# Patient Record
Sex: Female | Born: 1950 | Race: White | Hispanic: No | Marital: Married | Smoking: Current some day smoker
Health system: Southern US, Community
[De-identification: ages and names within clinical notes are randomized; demographics above are authoritative.]

## PROBLEM LIST (undated history)

## (undated) DIAGNOSIS — E559 Vitamin D deficiency, unspecified: Secondary | ICD-10-CM

## (undated) DIAGNOSIS — M8580 Other specified disorders of bone density and structure, unspecified site: Secondary | ICD-10-CM

## (undated) DIAGNOSIS — M810 Age-related osteoporosis without current pathological fracture: Secondary | ICD-10-CM

## (undated) DIAGNOSIS — K219 Gastro-esophageal reflux disease without esophagitis: Secondary | ICD-10-CM

## (undated) DIAGNOSIS — F411 Generalized anxiety disorder: Secondary | ICD-10-CM

## (undated) DIAGNOSIS — C73 Malignant neoplasm of thyroid gland: Secondary | ICD-10-CM

## (undated) DIAGNOSIS — R079 Chest pain, unspecified: Secondary | ICD-10-CM

## (undated) DIAGNOSIS — Z720 Tobacco use: Secondary | ICD-10-CM

## (undated) DIAGNOSIS — F32A Depression, unspecified: Secondary | ICD-10-CM

## (undated) DIAGNOSIS — E032 Hypothyroidism due to medicaments and other exogenous substances: Secondary | ICD-10-CM

## (undated) HISTORY — PX: THYROID SURGERY: SHX805

## (undated) HISTORY — DX: Vitamin D deficiency, unspecified: E55.9

## (undated) HISTORY — DX: Chest pain, unspecified: R07.9

## (undated) HISTORY — DX: Hypothyroidism due to medicaments and other exogenous substances: E03.2

## (undated) HISTORY — DX: Age-related osteoporosis without current pathological fracture: M81.0

## (undated) HISTORY — DX: Tobacco use: Z72.0

## (undated) HISTORY — DX: Generalized anxiety disorder: F41.1

## (undated) HISTORY — DX: Malignant neoplasm of thyroid gland: C73

---

## 1981-05-08 HISTORY — PX: CARPAL TUNNEL RELEASE: SHX101

## 2003-10-06 HISTORY — PX: COLONOSCOPY W/ POLYPECTOMY: SHX1380

## 2008-02-13 ENCOUNTER — Ambulatory Visit: Payer: Self-pay | Admitting: Otolaryngology

## 2008-05-08 HISTORY — PX: COLONOSCOPY: SHX174

## 2008-08-11 ENCOUNTER — Ambulatory Visit: Payer: Self-pay | Admitting: General Surgery

## 2015-07-12 ENCOUNTER — Emergency Department: Payer: BLUE CROSS/BLUE SHIELD

## 2015-07-12 ENCOUNTER — Emergency Department
Admission: EM | Admit: 2015-07-12 | Discharge: 2015-07-12 | Disposition: A | Discharge status: Home / Self Care | Payer: BLUE CROSS/BLUE SHIELD | Attending: Emergency Medicine | Admitting: Emergency Medicine

## 2015-07-12 DIAGNOSIS — T189XXA Foreign body of alimentary tract, part unspecified, initial encounter: Secondary | ICD-10-CM | POA: Diagnosis not present

## 2015-07-12 DIAGNOSIS — F17200 Nicotine dependence, unspecified, uncomplicated: Secondary | ICD-10-CM | POA: Diagnosis not present

## 2015-07-12 DIAGNOSIS — R070 Pain in throat: Secondary | ICD-10-CM

## 2015-07-12 DIAGNOSIS — Y93E1 Activity, personal bathing and showering: Secondary | ICD-10-CM | POA: Insufficient documentation

## 2015-07-12 DIAGNOSIS — Y998 Other external cause status: Secondary | ICD-10-CM | POA: Insufficient documentation

## 2015-07-12 DIAGNOSIS — X58XXXA Exposure to other specified factors, initial encounter: Secondary | ICD-10-CM | POA: Diagnosis not present

## 2015-07-12 DIAGNOSIS — Y9289 Other specified places as the place of occurrence of the external cause: Secondary | ICD-10-CM | POA: Insufficient documentation

## 2015-07-12 NOTE — ED Notes (Signed)
Xray negative; pt changed to flex wait

## 2015-07-12 NOTE — ED Notes (Signed)
Pt arrives to ER via POV states that she swallowed a plastic baby at a baby shower yesterday on accident. States that plastic baby was approx 0.5inch. Pt does not have any trouble breathing or talking. Secretions normal. Pt tried to get it down by eating and drinking today with no success. Pt hx of fishbone stuck in throat x 2 previously. Pt denies pain. Pt alert and oriented X4, active, cooperative, pt in NAD. RR even and unlabored, color WNL.

## 2015-07-12 NOTE — ED Provider Notes (Signed)
CSN: [id number]     Arrival date & time 07/12/15  1856 History   First MD Initiated Contact with Patient 07/12/15 2039     Chief Complaint  Patient presents with  . Swallowed Foreign Body  . Foreign Body     (Consider location/radiation/quality/duration/timing/severity/associated sxs/prior Treatment) HPI  65 year old female presents to emergency department for evaluation of possible foreign body ingestion. Patient states yesterday afternoon, she was at a baby shower, she thinks she may have swallowed a 3 x 1.5 cm soft plastic/rubber baby. After the possible ingestion, patient felt slight discomfort in the left side of her throat that she describes as pressure. Patient did eat dinner last night with no difficulty. Today, this morning patient had a piece of toast and she has had water throughout the day. She has not wanted to eat much today due to the discomfort along the left pharyngeal area. She denies any pain. She is tolerating her saliva well. She denies any chest pain or shortness of breath.  History reviewed. No pertinent past medical history. History reviewed. No pertinent past surgical history. No family history on file. Social History  Substance Use Topics  . Smoking status: Current Every Day Smoker  . Smokeless tobacco: None  . Alcohol Use: No   OB History    No data available     Review of Systems  Constitutional: Negative for fever, chills, activity change and fatigue.  HENT: Positive for sore throat. Negative for congestion and sinus pressure.   Eyes: Negative for visual disturbance.  Respiratory: Negative for cough, choking, chest tightness and shortness of breath.   Cardiovascular: Negative for chest pain and leg swelling.  Gastrointestinal: Negative for nausea, vomiting, abdominal pain and diarrhea.  Genitourinary: Negative for dysuria.  Musculoskeletal: Negative for arthralgias and gait problem.  Skin: Negative for rash.  Neurological: Negative for weakness,  numbness and headaches.  Hematological: Negative for adenopathy.  Psychiatric/Behavioral: Negative for behavioral problems, confusion and agitation.      Allergies  Codeine  Home Medications   Prior to Admission medications   Not on File   BP 131/82 mmHg  Pulse 90  Temp(Src) 97.6 F (36.4 C) (Oral)  Resp 20  Ht 5\' 3"  (1.6 m)  Wt 70.308 kg  BMI 27.46 kg/m2  SpO2 98% Physical Exam  Constitutional: She is oriented to person, place, and time. She appears well-developed and well-nourished. No distress.  HENT:  Head: Normocephalic and atraumatic.  Mouth/Throat: Oropharynx is clear and moist. No oropharyngeal exudate.  No visible foreign body in the retropharyngeal space.  Eyes: EOM are normal. Pupils are equal, round, and reactive to light. Right eye exhibits no discharge. Left eye exhibits no discharge.  Neck: Normal range of motion. Neck supple. No tracheal deviation present.  Cardiovascular: Normal rate, regular rhythm and intact distal pulses.   Pulmonary/Chest: Effort normal and breath sounds normal. No stridor. No respiratory distress. She has no wheezes. She has no rales. She exhibits no tenderness.  Abdominal: Soft. She exhibits no distension. There is no tenderness.  Musculoskeletal: Normal range of motion. She exhibits no edema.  Lymphadenopathy:    She has no cervical adenopathy.  Neurological: She is alert and oriented to person, place, and time. She has normal reflexes.  Skin: Skin is warm and dry.  Psychiatric: She has a normal mood and affect. Her behavior is normal. Thought content normal.    ED Course  Procedures (including critical care time) Labs Review Labs Reviewed - No data to display  Imaging  Review Dg Neck Soft Tissue  07/12/2015  CLINICAL DATA:  Pt states she swallowed a plastic baby at a baby shower yesterday on accident. States that plastic baby was approx 0.5inch. Pt does not have any trouble breathing or talking. Pt tried to get it down by  eating and drinking today but feels like something is caught in her throat EXAM: NECK SOFT TISSUES - 1+ VIEW COMPARISON:  None. FINDINGS: Moderate degenerative change to the central cervical spine. No prevertebral soft tissue swelling. Epiglottis appears normal. No opaque foreign body. IMPRESSION: No acute finding Electronically Signed   By: Skipper Cliche M.D.   On: 07/12/2015 19:36   Dg Chest 2 View  07/12/2015  CLINICAL DATA:  Patient reports swallowing a plastic foreign body yesterday. Persistent foreign body sensation in throat. EXAM: CHEST  2 VIEW COMPARISON:  None. FINDINGS: The heart size and mediastinal contours are normal. The lungs are clear. There is no pleural effusion or pneumothorax. No foreign bodies are identified within the chest. The bones appear normal. IMPRESSION: No active cardiopulmonary process. No evidence of foreign body within the chest. Plastic foreign bodies may not be visible radiographically. Electronically Signed   By: Richardean Sale M.D.   On: 07/12/2015 19:36   I have personally reviewed and evaluated these images and lab results as part of my medical decision-making.   EKG Interpretation None      MDM   Final diagnoses:  Throat pain in adult  Foreign body ingestion, initial encounter    65 year old female with possible foreign body ingestion. Patient estimates size to be 3 x 1.5 cm, soft rubber object. She is in no respiratory distress, tolerating her secretions well. No coughing or distress. She has tolerated by mouth well today. Discussed with ENT, patient likely with irritation of the esophageal mucosa. If no improvement in 2-3 days, she will need to follow-up with ENT for barium swallow. Patient's given information to follow-up with Dr. Ladene Artist. Return to the ER for any worsening symptoms or urgent changes in her health.  Duanne Guess, PA-C 07/12/15 2131  Orbie Pyo, MD 07/12/15 [phone]

## 2015-07-12 NOTE — Discharge Instructions (Signed)
Swallowed Foreign Body, Adult °A swallowed foreign body is an object that gets stuck in the tube that connects your throat to your stomach (esophagus) or in another part of your digestive tract. Foreign bodies may be swallowed by accident or on purpose. °When you swallow an object, it passes into your esophagus. The narrowest place in your digestive system is where your esophagus meets your stomach. If the object can pass through that place, it will usually continue through the rest of your digestive system without causing problems. A foreign body that gets stuck may need to be removed. °It is very important to tell your health care provider what you have swallowed. Certain swallowed items can be life-threatening. You may need emergency treatment. Dangerous swallowed foreign bodies include: °· Objects that get stuck in your throat. °· Objects that interfere with your breathing. °· Sharp objects. °· Harmful objects, such as batteries or illegal drugs. °CAUSES °The most common swallowed foreign body is food that will not pass through your esophagus to your stomach (food impaction). Foods that commonly become impacted include meats and hard vegetables, such as carrots and radishes. Other common swallowed foreign bodies include: °· Pieces of bone from meats. °· Toothpicks. °· Dentures. °RISK FACTORS °You are more likely to have a swallowed foreign body if: °· You wear dentures. °· You have been drinking alcohol or taking drugs. °· You have a mental health condition. °· You have a narrowed or scarred area in your digestive tract. °SYMPTOMS °· Pain or pressure in your throat or chest. °· Not being able to swallow food or liquid. °· Not being able to swallow your saliva. °· Choking. °· Hoarse voice. °· Trouble breathing. °DIAGNOSIS °This condition may be diagnosed based on your symptoms and medical history. Your health care provider will do a physical exam to confirm the diagnosis and to find the object. Imaging studies  may be done, including: °· X-rays. °· A CT scan. °Some objects may not be seen on imaging studies. In those cases, an exam may be done using a long tubelike scope to look into your esophagus (endoscopy). The tube (endoscope) that is used for this exam may be stiff (rigid) or flexible, depending on where the foreign body is stuck. °TREATMENT °Usually, an object that has passed into your stomach but is not dangerous will pass out of your digestive system without treatment. °If the swallowed object is not dangerous but it is stuck in your esophagus: °· You may be given medicine to relax the muscles of your esophagus to allow the object to pass through. °· Endoscopy may be done to find and remove the object if it does not pass with medicine. Your health care provider will put medical instruments through the endoscope to remove the object. °You may need emergency treatment if: °· The object is in your esophagus and is causing you to inhale saliva into your lungs (aspirate). °· The object is in your esophagus and is pressing on your airway. This makes it hard for you to breathe. °· The object can damage your digestive tract. Some objects that can cause damage include batteries, magnets, sharp objects, and drugs. °HOME CARE INSTRUCTIONS °If the object in your digestive system is expected to pass: °· Continue eating what you usually eat, unless your health care provider gives you different instructions. °· Check your stool after every bowel movement to see if you have passed the object. °· Contact your health care provider if the object has not passed after 3   days. °If you had endoscopic surgery to remove the foreign body: °· Follow instructions from your health care provider about caring for yourself after the procedure. °Keep all follow-up visits and repeat imaging tests as told by your health care provider. This is important. °SEEK MEDICAL CARE IF: °· You continue to have symptoms of a swallowed foreign body. °· The  object has not passed out of your body after 3 days. °SEEK IMMEDIATE MEDICAL CARE IF: °· You have a fever. °· You have pain in your chest or your abdomen. °· You cough up blood. °· You have blood in your stool (feces) or your vomit. °  °This information is not intended to replace advice given to you by your health care provider. Make sure you discuss any questions you have with your health care provider. °  °Document Released: 10/12/2009 Document Revised: 01/13/2015 Document Reviewed: 07/22/2014 °Elsevier Interactive Patient Education ©2016 Elsevier Inc. ° °

## 2015-07-12 NOTE — ED Notes (Signed)
Patient with no complaints at this time. Respirations even and unlabored. Skin warm/dry. Discharge instructions reviewed with patient at this time. Patient given opportunity to voice concerns/ask questions. Patient discharged at this time and left Emergency Department with steady gait.   

## 2016-10-09 ENCOUNTER — Encounter: Payer: Self-pay | Admitting: *Deleted

## 2016-10-12 ENCOUNTER — Ambulatory Visit (INDEPENDENT_AMBULATORY_CARE_PROVIDER_SITE_OTHER): Payer: BLUE CROSS/BLUE SHIELD | Admitting: General Surgery

## 2016-10-12 ENCOUNTER — Encounter: Payer: Self-pay | Admitting: General Surgery

## 2016-10-12 VITALS — BP 124/78 | HR 72 | Ht 62.0 in | Wt 152.0 lb

## 2016-10-12 DIAGNOSIS — Z86010 Personal history of colonic polyps: Secondary | ICD-10-CM

## 2016-10-12 MED ORDER — POLYETHYLENE GLYCOL 3350 17 GM/SCOOP PO POWD: 1.0000 | Freq: Once | ORAL | 0 refills | Status: AC

## 2016-10-12 NOTE — Progress Notes (Signed)
Patient ID: Alyssa Holland, female   DOB: 1950/11/10, 66 y.o.   MRN: [id number]  Chief Complaint  Patient presents with  . Colonoscopy    HPI [name] is a 66 y.o. female here today for a evaluation of a colonoscopy. Last colonoscopy was done on 08/11/2008. Patient states she is moving her bowels daily. No Gi problems at this time.    HPI  Past Medical History:  Diagnosis Date  . Thyroid disease     Past Surgical History:  Procedure Laterality Date  . CARPAL TUNNEL RELEASE Bilateral 1983  . COLONOSCOPY  2010   Normal  . COLONOSCOPY W/ POLYPECTOMY  10/06/2003   9 mm pedunculated tubular adenoma without atypia  . THYROID SURGERY Right     No family history on file.  Social History Social History  Substance Use Topics  . Smoking status: Current Every Day Smoker    Packs/day: 1.00    Types: Cigarettes  . Smokeless tobacco: Never Used  . Alcohol use No    Allergies  Allergen Reactions  . Codeine     Current Outpatient Prescriptions  Medication Sig Dispense Refill  . Calcium Carb-Cholecalciferol (CALCIUM PLUS VITAMIN D3 PO) Take 1,200 mg by mouth daily.    . citalopram (CELEXA) 20 MG tablet Take 20 mg by mouth daily.    . ergocalciferol (VITAMIN D2) 50000 units capsule Take 50,000 Units by mouth once a week.    . levothyroxine (SYNTHROID, LEVOTHROID) 100 MCG tablet Take 100 mcg by mouth daily before breakfast.     No current facility-administered medications for this visit.     Review of Systems Review of Systems  Constitutional: Negative.   Respiratory: Negative.   Cardiovascular: Negative.     Blood pressure 124/78, pulse 72, height 5\' 2"  (1.575 m), weight 152 lb (68.9 kg).  Physical Exam Physical Exam  Constitutional: She is oriented to person, place, and time. She appears well-developed and well-nourished.  Eyes: Conjunctivae are normal. No scleral icterus.  Neck: Neck supple.  Cardiovascular: Normal rate, regular rhythm and normal heart  sounds.   Pulmonary/Chest: Effort normal and breath sounds normal.  Lymphadenopathy:    She has no cervical adenopathy.  Neurological: She is alert and oriented to person, place, and time.  Skin: Skin is warm and dry.    Data Reviewed 9 mm tubular adenoma the sigmoid colon and 2005. Normal exam in 2010.  Assessment    Candidate for screening colonoscopy.     Plan        Colonoscopy with possible biopsy/polypectomy prn: Information regarding the procedure, including its potential risks and complications (including but not limited to perforation of the bowel, which may require emergency surgery to repair, and bleeding) was verbally given to the patient. Educational information regarding lower intestinal endoscopy was given to the patient. Written instructions for how to complete the bowel prep using Miralax were provided. The importance of drinking ample fluids to avoid dehydration as a result of the prep emphasized.  HPI, Physical Exam, Assessment and Plan have been scribed under the direction and in the presence of Hervey Ard, MD.  Gaspar Cola, CMA'  I have completed the exam and reviewed the above documentation for accuracy and completeness.  I agree with the above.  Haematologist has been used and any errors in dictation or transcription are unintentional.  Hervey Ard, M.D., F.A.C.S.   The patient is scheduled for a Colonoscopy at [facility] on 12/19/16. They are aware to call the day before  to get their arrival time. Miralax prescription has been sent into the patient's pharmacy. The patient is aware of date and instructions.  Documented by Lesly Rubenstein LPN  Robert Bellow 10/13/2016, 9:40 PM

## 2016-10-12 NOTE — Patient Instructions (Addendum)
Colonoscopy, Adult A colonoscopy is an exam to look at the entire large intestine. During the exam, a lubricated, bendable tube is inserted into the anus and then passed into the rectum, colon, and other parts of the large intestine. A colonoscopy is often done as a part of normal colorectal screening or in response to certain symptoms, such as anemia, persistent diarrhea, abdominal pain, and blood in the stool. The exam can help screen for and diagnose medical problems, including:  Tumors.  Polyps.  Inflammation.  Areas of bleeding.  Tell a health care provider about:  Any allergies you have.  All medicines you are taking, including vitamins, herbs, eye drops, creams, and over-the-counter medicines.  Any problems you or family members have had with anesthetic medicines.  Any blood disorders you have.  Any surgeries you have had.  Any medical conditions you have.  Any problems you have had passing stool. What are the risks? Generally, this is a safe procedure. However, problems may occur, including:  Bleeding.  A tear in the intestine.  A reaction to medicines given during the exam.  Infection (rare).  What happens before the procedure? Eating and drinking restrictions Follow instructions from your health care provider about eating and drinking, which may include:  A few days before the procedure - follow a low-fiber diet. Avoid nuts, seeds, dried fruit, raw fruits, and vegetables.  1-3 days before the procedure - follow a clear liquid diet. Drink only clear liquids, such as clear broth or bouillon, black coffee or tea, clear juice, clear soft drinks or sports drinks, gelatin dessert, and popsicles. Avoid any liquids that contain red or purple dye.  On the day of the procedure - do not eat or drink anything during the 2 hours before the procedure, or within the time period that your health care provider recommends.  Bowel prep If you were prescribed an oral bowel prep  to clean out your colon:  Take it as told by your health care provider. Starting the day before your procedure, you will need to drink a large amount of medicated liquid. The liquid will cause you to have multiple loose stools until your stool is almost clear or light green.  If your skin or anus gets irritated from diarrhea, you may use these to relieve the irritation: ? Medicated wipes, such as adult wet wipes with aloe and vitamin E. ? A skin soothing-product like petroleum jelly.  If you vomit while drinking the bowel prep, take a break for up to 60 minutes and then begin the bowel prep again. If vomiting continues and you cannot take the bowel prep without vomiting, call your health care provider.  General instructions  Ask your health care provider about changing or stopping your regular medicines. This is especially important if you are taking diabetes medicines or blood thinners.  Plan to have someone take you home from the hospital or clinic. What happens during the procedure?  An IV tube may be inserted into one of your veins.  You will be given medicine to help you relax (sedative).  To reduce your risk of infection: ? Your health care team will wash or sanitize their hands. ? Your anal area will be washed with soap.  You will be asked to lie on your side with your knees bent.  Your health care provider will lubricate a long, thin, flexible tube. The tube will have a camera and a light on the end.  The tube will be inserted into your   anus.  The tube will be gently eased through your rectum and colon.  Air will be delivered into your colon to keep it open. You may feel some pressure or cramping.  The camera will be used to take images during the procedure.  A small tissue sample may be removed from your body to be examined under a microscope (biopsy). If any potential problems are found, the tissue will be sent to a lab for testing.  If small polyps are found, your  health care provider may remove them and have them checked for cancer cells.  The tube that was inserted into your anus will be slowly removed. The procedure may vary among health care providers and hospitals. What happens after the procedure?  Your blood pressure, heart rate, breathing rate, and blood oxygen level will be monitored until the medicines you were given have worn off.  Do not drive for 24 hours after the exam.  You may have a small amount of blood in your stool.  You may pass gas and have mild abdominal cramping or bloating due to the air that was used to inflate your colon during the exam.  It is up to you to get the results of your procedure. Ask your health care provider, or the department performing the procedure, when your results will be ready. This information is not intended to replace advice given to you by your health care provider. Make sure you discuss any questions you have with your health care provider. Document Released: 04/21/2000 Document Revised: 02/23/2016 Document Reviewed: 07/06/2015 Elsevier Interactive Patient Education  [employer].  The patient is scheduled for a Colonoscopy at [facility] on 12/19/16. They are aware to call the day before to get their arrival time. Miralax prescription has been sent into the patient's pharmacy. The patient is aware of date and instructions.

## 2016-10-13 ENCOUNTER — Encounter: Payer: Self-pay | Admitting: General Surgery

## 2016-10-13 DIAGNOSIS — Z86010 Personal history of colonic polyps: Secondary | ICD-10-CM | POA: Insufficient documentation

## 2016-12-19 ENCOUNTER — Ambulatory Visit: Payer: BLUE CROSS/BLUE SHIELD | Admitting: Anesthesiology

## 2016-12-19 ENCOUNTER — Ambulatory Visit
Admission: RE | Admit: 2016-12-19 | Discharge: 2016-12-19 | Disposition: A | Discharge status: Home / Self Care | Payer: BLUE CROSS/BLUE SHIELD | Source: Ambulatory Visit | Attending: General Surgery | Admitting: General Surgery

## 2016-12-19 ENCOUNTER — Encounter: Payer: Self-pay | Admitting: *Deleted

## 2016-12-19 ENCOUNTER — Encounter
Admission: RE | Disposition: A | Discharge status: Home / Self Care | Payer: Self-pay | Source: Ambulatory Visit | Attending: General Surgery

## 2016-12-19 DIAGNOSIS — E079 Disorder of thyroid, unspecified: Secondary | ICD-10-CM | POA: Insufficient documentation

## 2016-12-19 DIAGNOSIS — Z1211 Encounter for screening for malignant neoplasm of colon: Secondary | ICD-10-CM | POA: Diagnosis not present

## 2016-12-19 DIAGNOSIS — Z86010 Personal history of colonic polyps: Secondary | ICD-10-CM

## 2016-12-19 DIAGNOSIS — K219 Gastro-esophageal reflux disease without esophagitis: Secondary | ICD-10-CM | POA: Insufficient documentation

## 2016-12-19 DIAGNOSIS — Z885 Allergy status to narcotic agent status: Secondary | ICD-10-CM | POA: Diagnosis not present

## 2016-12-19 DIAGNOSIS — F17210 Nicotine dependence, cigarettes, uncomplicated: Secondary | ICD-10-CM | POA: Insufficient documentation

## 2016-12-19 HISTORY — DX: Gastro-esophageal reflux disease without esophagitis: K21.9

## 2016-12-19 HISTORY — PX: COLONOSCOPY WITH PROPOFOL: SHX5780

## 2016-12-19 SURGERY — COLONOSCOPY WITH PROPOFOL
Anesthesia: General

## 2016-12-19 MED ORDER — MIDAZOLAM HCL 2 MG/2ML IJ SOLN
INTRAMUSCULAR | Status: AC
  Filled 2016-12-19: qty 2

## 2016-12-19 MED ORDER — PROPOFOL 500 MG/50ML IV EMUL
INTRAVENOUS | Status: DC | PRN
  Administered 2016-12-19: 120 ug/kg/min via INTRAVENOUS

## 2016-12-19 MED ORDER — PROPOFOL 10 MG/ML IV BOLUS
INTRAVENOUS | Status: DC | PRN
  Administered 2016-12-19: 50 mg via INTRAVENOUS

## 2016-12-19 MED ORDER — PROPOFOL 500 MG/50ML IV EMUL
INTRAVENOUS | Status: AC
  Filled 2016-12-19: qty 50

## 2016-12-19 MED ORDER — SODIUM CHLORIDE 0.9 % IV SOLN
INTRAVENOUS | Status: DC
  Administered 2016-12-19: 1000 mL via INTRAVENOUS
  Administered 2016-12-19: 14:00:00 via INTRAVENOUS

## 2016-12-19 MED ORDER — LIDOCAINE HCL (PF) 2 % IJ SOLN
INTRAMUSCULAR | Status: AC
  Filled 2016-12-19: qty 2

## 2016-12-19 MED ORDER — FENTANYL CITRATE (PF) 100 MCG/2ML IJ SOLN
INTRAMUSCULAR | Status: AC
  Filled 2016-12-19: qty 2

## 2016-12-19 MED ORDER — LIDOCAINE HCL (CARDIAC) 20 MG/ML IV SOLN
INTRAVENOUS | Status: DC | PRN
  Administered 2016-12-19: 60 mg via INTRAVENOUS

## 2016-12-19 NOTE — Anesthesia Post-op Follow-up Note (Signed)
Anesthesia QCDR form completed.        

## 2016-12-19 NOTE — Op Note (Signed)
[facility] Gastroenterology Patient Name: Alyssa Holland Procedure Date: 12/19/2016 12:44 PM MRN: [id number] Account #: [id number] Date of Birth: 08-Aug-1950 Admit Type: Outpatient Age: 66 Room: [facility] ENDO ROOM 1 Gender: Female Note Status: Finalized Procedure:            Colonoscopy Indications:          High risk colon cancer surveillance: Personal history                        of colonic polyps Providers:            Robert Bellow, MD Referring MD:         Lenard Simmer, MD (Referring MD) Medicines:            Monitored Anesthesia Care Complications:        No immediate complications. Procedure:            Pre-Anesthesia Assessment:                       - Prior to the procedure, a History and Physical was                        performed, and patient medications, allergies and                        sensitivities were reviewed. The patient's tolerance of                        previous anesthesia was reviewed.                       - The risks and benefits of the procedure and the                        sedation options and risks were discussed with the                        patient. All questions were answered and informed                        consent was obtained.                       After obtaining informed consent, the colonoscope was                        passed under direct vision. Throughout the procedure,                        the patient's blood pressure, pulse, and oxygen                        saturations were monitored continuously. The                        Colonoscope was introduced through the anus and                        advanced to the the cecum, identified by appendiceal  orifice and ileocecal valve. The colonoscopy was                        somewhat difficult due to significant looping.                        Successful completion of the procedure was aided by                        using manual  pressure. The patient tolerated the                        procedure well. The quality of the bowel preparation                        was excellent. Findings:      The entire examined colon appeared normal on direct and retroflexion       views. Impression:           - The entire examined colon is normal on direct and                        retroflexion views.                       - No specimens collected. Recommendation:       - Repeat colonoscopy in 10 years for screening purposes. Procedure Code(s):    --- Professional ---                       [phone], Colonoscopy, flexible; diagnostic, including                        collection of specimen(s) by brushing or washing, when                        performed (separate procedure) Diagnosis Code(s):    --- Professional ---                       Z86.010, Personal history of colonic polyps CPT copyright 2016 American Medical Association. All rights reserved. The codes documented in this report are preliminary and upon coder review may  be revised to meet current compliance requirements. Robert Bellow, MD 12/19/2016 2:18:16 PM This report has been signed electronically. Number of Addenda: 0 Note Initiated On: 12/19/2016 12:44 PM Scope Withdrawal Time: 0 hours 8 minutes 32 seconds  Total Procedure Duration: 0 hours 26 minutes 24 seconds       [facility]

## 2016-12-19 NOTE — Anesthesia Preprocedure Evaluation (Signed)
Anesthesia Evaluation  Patient identified by MRN, date of birth, ID band Patient awake    Reviewed: Allergy & Precautions, NPO status , Patient's Chart, lab work & pertinent test results  Airway Mallampati: II  TM Distance: >3 FB     Dental no notable dental hx.    Pulmonary Current Smoker,    Pulmonary exam normal        Cardiovascular negative cardio ROS Normal cardiovascular exam     Neuro/Psych negative neurological ROS  negative psych ROS   GI/Hepatic Neg liver ROS, GERD  Medicated,  Endo/Other  negative endocrine ROS  Renal/GU negative Renal ROS  negative genitourinary   Musculoskeletal negative musculoskeletal ROS (+)   Abdominal Normal abdominal exam  (+)   Peds negative pediatric ROS (+)  Hematology negative hematology ROS (+)   Anesthesia Other Findings   Reproductive/Obstetrics                            Anesthesia Physical Anesthesia Plan  ASA: II  Anesthesia Plan: General   Post-op Pain Management:    Induction: Intravenous  PONV Risk Score and Plan:   Airway Management Planned: Nasal Cannula  Additional Equipment:   Intra-op Plan:   Post-operative Plan:   Informed Consent: I have reviewed the patients History and Physical, chart, labs and discussed the procedure including the risks, benefits and alternatives for the proposed anesthesia with the patient or authorized representative who has indicated his/her understanding and acceptance.   Dental advisory given  Plan Discussed with: CRNA and Surgeon  Anesthesia Plan Comments:         Anesthesia Quick Evaluation

## 2016-12-19 NOTE — H&P (Signed)
Alyssa Holland [id number] 03-08-1951     HPI:  History of tubular adenoma in 2005,  Normal exam in 2010. For followup colonoscopy. Tolerated prep well.   Prescriptions Prior to Admission  Medication Sig Dispense Refill Last Dose  . Calcium Carb-Cholecalciferol (CALCIUM PLUS VITAMIN D3 PO) Take 1,200 mg by mouth daily.   Past Week at Unknown time  . citalopram (CELEXA) 20 MG tablet Take 20 mg by mouth daily.   Past Week at Unknown time  . ergocalciferol (VITAMIN D2) 50000 units capsule Take 50,000 Units by mouth once a week.   Past Week at Unknown time  . levothyroxine (SYNTHROID, LEVOTHROID) 100 MCG tablet Take 100 mcg by mouth daily before breakfast.   Past Week at Unknown time   Allergies  Allergen Reactions  . Codeine    Past Medical History:  Diagnosis Date  . GERD (gastroesophageal reflux disease)   . Thyroid disease    Past Surgical History:  Procedure Laterality Date  . CARPAL TUNNEL RELEASE Bilateral 1983  . COLONOSCOPY  2010   Normal  . COLONOSCOPY W/ POLYPECTOMY  10/06/2003   9 mm pedunculated tubular adenoma without atypia  . THYROID SURGERY Right    Social History   Social History  . Marital status: Married    Spouse name: N/A  . Number of children: N/A  . Years of education: N/A   Occupational History  . Not on file.   Social History Main Topics  . Smoking status: Current Every Day Smoker    Packs/day: 1.00    Types: Cigarettes  . Smokeless tobacco: Never Used  . Alcohol use No  . Drug use: No  . Sexual activity: Not on file   Other Topics Concern  . Not on file   Social History Narrative  . No narrative on file   Social History   Social History Narrative  . No narrative on file     ROS: Negative.     PE: HEENT: Negative. Lungs: Clear. Cardio: RR. Robert Bellow 12/19/2016   Assessment/Plan:  Proceed with planned endoscopy.

## 2016-12-19 NOTE — Anesthesia Postprocedure Evaluation (Signed)
Anesthesia Post Note  Patient: Alyssa Holland  Procedure(s) Performed: Procedure(s) (LRB): COLONOSCOPY WITH PROPOFOL (N/A)  Patient location during evaluation: PACU Anesthesia Type: General Level of consciousness: awake and alert and oriented Pain management: pain level controlled Vital Signs Assessment: post-procedure vital signs reviewed and stable Respiratory status: spontaneous breathing Cardiovascular status: blood pressure returned to baseline Anesthetic complications: no     Last Vitals:  Vitals:   12/19/16 1422 12/19/16 1432  BP: 90/61 96/65  Pulse: (!) 57 (!) 52  Resp: 14 13  Temp: (!) 36.2 C   SpO2: 100% 100%    Last Pain:  Vitals:   12/19/16 1422  TempSrc: Tympanic                 Issaiah Seabrooks

## 2016-12-19 NOTE — Transfer of Care (Signed)
Immediate Anesthesia Transfer of Care Note  Patient: Alyssa Holland  Procedure(s) Performed: Procedure(s): COLONOSCOPY WITH PROPOFOL (N/A)  Patient Location: PACU  Anesthesia Type:General  Level of Consciousness: awake and sedated  Airway & Oxygen Therapy: Patient Spontanous Breathing and Patient connected to nasal cannula oxygen  Post-op Assessment: Report given to RN and Post -op Vital signs reviewed and stable  Post vital signs: Reviewed and stable  Last Vitals:  Vitals:   12/19/16 1240 12/19/16 1422  BP: 110/73 90/61  Pulse:  (!) 57  Resp:  14  Temp: (!) 36.1 C (!) 36.2 C  SpO2: 98% 100%    Last Pain:  Vitals:   12/19/16 1422  TempSrc: Tympanic      Patients Stated Pain Goal: 0 (08/67/61 9509)  Complications: No apparent anesthesia complications

## 2016-12-20 ENCOUNTER — Encounter: Payer: Self-pay | Admitting: General Surgery

## 2017-01-04 ENCOUNTER — Emergency Department
Admission: EM | Admit: 2017-01-04 | Discharge: 2017-01-04 | Disposition: A | Discharge status: Home / Self Care | Payer: BLUE CROSS/BLUE SHIELD | Attending: Emergency Medicine | Admitting: Emergency Medicine

## 2017-01-04 ENCOUNTER — Encounter: Payer: Self-pay | Admitting: Emergency Medicine

## 2017-01-04 ENCOUNTER — Emergency Department: Payer: BLUE CROSS/BLUE SHIELD

## 2017-01-04 DIAGNOSIS — R079 Chest pain, unspecified: Secondary | ICD-10-CM

## 2017-01-04 DIAGNOSIS — F17210 Nicotine dependence, cigarettes, uncomplicated: Secondary | ICD-10-CM | POA: Diagnosis not present

## 2017-01-04 DIAGNOSIS — N644 Mastodynia: Secondary | ICD-10-CM | POA: Diagnosis not present

## 2017-01-04 LAB — BASIC METABOLIC PANEL
Anion gap: 9 (ref 5–15)
BUN: 10 mg/dL (ref 6–20)
CHLORIDE: 103 mmol/L (ref 101–111)
CO2: 25 mmol/L (ref 22–32)
Calcium: 9 mg/dL (ref 8.9–10.3)
Creatinine, Ser: 0.79 mg/dL (ref 0.44–1.00)
GFR calc Af Amer: >60 mL/min (ref >60)
GFR calc non Af Amer: >60 mL/min (ref >60)
Glucose, Bld: 114 mg/dL — ABNORMAL HIGH (ref 65–99)
POTASSIUM: 3.7 mmol/L (ref 3.5–5.1)
SODIUM: 137 mmol/L (ref 135–145)

## 2017-01-04 LAB — CBC
HEMATOCRIT: 37.7 % (ref 35.0–47.0)
HEMOGLOBIN: 13 g/dL (ref 12.0–16.0)
MCH: 30.8 pg (ref 26.0–34.0)
MCHC: 34.5 g/dL (ref 32.0–36.0)
MCV: 89.2 fL (ref 80.0–100.0)
Platelets: 280 10*3/uL (ref 150–440)
RBC: 4.22 MIL/uL (ref 3.80–5.20)
RDW: 12.9 % (ref 11.5–14.5)
WBC: 7.8 10*3/uL (ref 3.6–11.0)

## 2017-01-04 LAB — TROPONIN I: Troponin I: <0.03 ng/mL (ref <0.03)

## 2017-01-04 NOTE — ED Notes (Signed)
Pt has left side chest pain since 4am today.  Pt states it woke her up.  No n/v/d  No diaphoresis.  Pt alert  Family with pt.  nsr on monitor.

## 2017-01-04 NOTE — ED Triage Notes (Signed)
Pt reports an episode of left side chest pain more so in her left breast at 0500 today. Pt states it felt like a squeezing sensation. Pt denies any associated symptoms. Pt states the pain did not last long and she has not felt it since this morning. Pt states she is a smoker and began to worry that she should have it checked out.

## 2017-01-04 NOTE — ED Provider Notes (Signed)
[facility] Emergency Department Provider Note  ____________________________________________  Time seen: Approximately 9:08 PM  I have reviewed the triage vital signs and the nursing notes.   HISTORY  Chief Complaint Chest Pain    HPI NANCEE BROWNRIGG is a 66 y.o. female with a history of GERD presenting with chest pain. The patient reports that at 4:00 this morning, she woke up with a tingling sensation in the left breast and is squeezing pain on the left side of the chest. The pain did not radiate and was not associated with diaphoresis, palpitations, shortness of breath, lightheadedness or syncope. The pain resolved spontaneously after 15 minutes. She took an aspirin and some water. She has not been experiencing any chest pain over the past couple weeks. She has no cough or cold symptoms, trauma, or history of breast surgery. The patient's last stress test was 4 years ago, which she reports was normal.  SH: No tobacco   Past Medical History:  Diagnosis Date  . GERD (gastroesophageal reflux disease)   . Thyroid disease     Patient Active Problem List   Diagnosis Date Noted  . History of colonic polyps 10/13/2016    Past Surgical History:  Procedure Laterality Date  . CARPAL TUNNEL RELEASE Bilateral 1983  . COLONOSCOPY  2010   Normal  . COLONOSCOPY W/ POLYPECTOMY  10/06/2003   9 mm pedunculated tubular adenoma without atypia  . COLONOSCOPY WITH PROPOFOL N/A 12/19/2016   Procedure: COLONOSCOPY WITH PROPOFOL;  Surgeon: Robert Bellow, MD;  Location: [facility] ENDOSCOPY;  Service: Endoscopy;  Laterality: N/A;  . THYROID SURGERY Right     Current Outpatient Rx  . Order #: [id number] Class: Historical Med  . Order #: [id number] Class: Historical Med  . Order #: [id number] Class: Historical Med  . Order #: [id number] Class: Historical Med    Allergies Codeine  No family history on file.  Social History Social History  Substance Use Topics  .  Smoking status: Current Every Day Smoker    Packs/day: 1.00    Types: Cigarettes  . Smokeless tobacco: Never Used  . Alcohol use No    Review of Systems Constitutional: No fever/chills.No lightheadedness or syncope. Eyes: No visual changes. ENT: No sore throat. No congestion or rhinorrhea. Cardiovascular: Positive left breast tingling and squeezing chest pain. Denies palpitations. Respiratory: Denies shortness of breath.  No cough. Gastrointestinal: No abdominal pain.  No nausea, no vomiting.  No diarrhea.  No constipation. Genitourinary: Negative for dysuria. Musculoskeletal: Negative for back pain. Skin: Negative for rash. Neurological: Negative for headaches. No focal numbness, tingling or weakness.     ____________________________________________   PHYSICAL EXAM:  VITAL SIGNS: ED Triage Vitals  Enc Vitals Group     BP 01/04/17 1857 113/80     Pulse Rate 01/04/17 1857 77     Resp 01/04/17 1857 18     Temp 01/04/17 1857 98.2 F (36.8 C)     Temp Source 01/04/17 1857 Oral     SpO2 01/04/17 1857 97 %     Weight 01/04/17 1858 152 lb (68.9 kg)     Height 01/04/17 1858 5' 2.5" (1.588 m)     Head Circumference --      Peak Flow --      Pain Score 01/04/17 1858 0     Pain Loc --      Pain Edu? --      Excl. in [place]? --     Constitutional: Alert and oriented. Well appearing and in no  acute distress. Answers questions appropriately. Eyes: Conjunctivae are normal.  EOMI. No scleral icterus. Head: Atraumatic. Nose: No congestion/rhinnorhea. Mouth/Throat: Mucous membranes are moist.  Neck: No stridor.  Supple.  No JVD. No meningismus Cardiovascular: Normal rate, regular rhythm. No murmurs, rubs or gallops.  Respiratory: Normal respiratory effort.  No accessory muscle use or retractions. Lungs CTAB.  No wheezes, rales or ronchi. Gastrointestinal: Soft, nontender and nondistended.  No guarding or rebound.  No peritoneal signs. Musculoskeletal: No LE edema. No ttp in the  calves or palpable cords.  Negative Homan's sign. Neurologic:  A&Ox3.  Speech is clear.  Face and smile are symmetric.  EOMI.  Moves all extremities well. Skin:  Skin is warm, dry and intact. No rash noted. Psychiatric: Mood and affect are normal. Speech and behavior are normal.  Normal judgement.  ____________________________________________   LABS (all labs ordered are listed, but only abnormal results are displayed)  Labs Reviewed  BASIC METABOLIC PANEL - Abnormal; Notable for the following:       Result Value   Glucose, Bld 114 (*)    All other components within normal limits  CBC  TROPONIN I   ____________________________________________  EKG  ED ECG REPORT I, Eula Listen, the attending physician, personally viewed and interpreted this ECG.   Date: 01/04/2017  EKG Time: 1852  Rate: 65  Rhythm: normal sinus rhythm  Axis: 65  Intervals:none  ST&T Change: Nonspecific T-wave inversions in V1. No ST elevation. No STEMI.  ____________________________________________  RADIOLOGY  Dg Chest 2 View  Result Date: 01/04/2017 CLINICAL DATA:  Left-sided chest pain EXAM: CHEST  2 VIEW COMPARISON:  07/12/2015 FINDINGS: The heart size and mediastinal contours are within normal limits. Both lungs are clear. The visualized skeletal structures demonstrate degenerative changes. IMPRESSION: No active cardiopulmonary disease. Electronically Signed   By: Donavan Foil M.D.   On: 01/04/2017 19:35    ____________________________________________   PROCEDURES  Procedure(s) performed: None  Procedures  Critical Care performed: No ____________________________________________   INITIAL IMPRESSION / ASSESSMENT AND PLAN / ED COURSE  Pertinent labs & imaging results that were available during my care of the patient were reviewed by me and considered in my medical decision making (see chart for details).  66 y.o. female without any significant cardiac risk factors her cardiac  history presenting with atypical chest pain that lasted 15 minutes and self resolved. At this time, the patient is medically stable and has been chest pain-free for more than 12 hours. Her EKG does not show ischemic changes her chest x-ray shows no acute cardiopulmonary process and her troponin is negative. I have very low suspicion for any other acute emergency including aortic pathology or PE. At this time, the patient is stable for outpatient evaluation and I will give her the contact information for the cardiologist on-call. Should her symptoms return precautions as well as follow-up instructions.  ____________________________________________  FINAL CLINICAL IMPRESSION(S) / ED DIAGNOSES  Final diagnoses:  Breast pain, left  Chest pain, unspecified type         NEW MEDICATIONS STARTED DURING THIS VISIT:  New Prescriptions   No medications on file      Eula Listen, MD 01/04/17 2113

## 2017-01-04 NOTE — Discharge Instructions (Signed)
Please make an appointment with a cardiologist for follow-up.  Return to the emergency department if you develop chest pain, shortness of breath, lightheadedness, fainting, palpitations, or any other symptoms concerning to you.

## 2017-01-05 ENCOUNTER — Telehealth: Payer: Self-pay | Admitting: Nurse Practitioner

## 2017-01-05 NOTE — Telephone Encounter (Signed)
Pt was seen in the ED yesterday, and needs f/u. Pt is scheduled for 10/2. Please call and advise if this is date is ok to wait that long.

## 2017-01-05 NOTE — Telephone Encounter (Signed)
Spoke with patient and she states her pain was more in the breast and feels that it may be due to a fibroid. Reviewed signs and symptoms to monitor for and if she develops chest pain, shortness of breath, or nausea to seek further evaluation in the ED. She verbalized understanding, confirmed appointment, and had no further questions at this time.

## 2017-02-06 ENCOUNTER — Encounter: Payer: Self-pay | Admitting: Nurse Practitioner

## 2017-02-06 ENCOUNTER — Ambulatory Visit (INDEPENDENT_AMBULATORY_CARE_PROVIDER_SITE_OTHER): Payer: BLUE CROSS/BLUE SHIELD | Admitting: Nurse Practitioner

## 2017-02-06 VITALS — BP 92/66 | HR 67 | Ht 62.5 in | Wt 153.5 lb

## 2017-02-06 DIAGNOSIS — R079 Chest pain, unspecified: Secondary | ICD-10-CM | POA: Diagnosis not present

## 2017-02-06 DIAGNOSIS — Z720 Tobacco use: Secondary | ICD-10-CM

## 2017-02-06 NOTE — Progress Notes (Signed)
Cardiology Clinic Note   Patient Name: Alyssa Holland Date of Encounter: 02/06/2017  Primary Care Provider:  Lenard Simmer, MD Primary Cardiologist:  Ney  Patient Profile    66 year old ? With a history of anxiety, GERD, osteoporosis, iatrogenic hypothyroidism, and tobacco abuse who presents for evaluation secondary to atypical chest pain.  Past Medical History    Past Medical History:  Diagnosis Date  . Chest pain    a. Reports prior nl stress tests w/ PCP - last 5+ yrs ago.  Marland Kitchen GAD (generalized anxiety disorder)   . GERD (gastroesophageal reflux disease)   . Iatrogenic hypothyroidism    a. s/p L thyroidectomy.  . Osteoporosis   . 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  . Tobacco abuse   . Vitamin D deficiency    Past Surgical History:  Procedure Laterality Date  . CARPAL TUNNEL RELEASE Bilateral 1983  . COLONOSCOPY  2010   Normal  . COLONOSCOPY W/ POLYPECTOMY  10/06/2003   9 mm pedunculated tubular adenoma without atypia  . COLONOSCOPY WITH PROPOFOL N/A 12/19/2016   Procedure: COLONOSCOPY WITH PROPOFOL;  Surgeon: Robert Bellow, MD;  Location: [facility] ENDOSCOPY;  Service: Endoscopy;  Laterality: N/A;  . THYROID SURGERY Right     Allergies  Allergies  Allergen Reactions  . 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 Patient also reports a history of prior stress testing. She thinks the last one occurred maybe about 5 or more years ago. She is not sure if she was having chest pain at the time or if it was done as a screening tool in the setting of a family history of coronary artery disease. Her mother had an MI in her 35s  while her sister had an MI at age 75, and a cousin that died of an MI in his 24s. She lives locally with her husband and is relatively active but does not exercise. She has smoked a half a pack a day for the past 30 years. She does not typically experience limitations in activities and doesn't go working out in the yard.  In the early morning hours of August 30, she awoke with left breast squeezing pain. This lasts about 5 minutes and resolve spontaneously. She was planning a trip for the weekend and wanted it checked out before she went out of town, and therefore she went to the emergency department. There, ECG was normal and enzymes were negative. She was discharged and advised to follow-up with cardiology. She went on her trip and since then has not had any issues. She denies any recent chest pain, dyspnea, palpitations, PND, orthopnea, dizziness, syncope, edema, or early satiety.  Home Medications    Prior to Admission medications   Medication Sig Start Date End Date Taking? Authorizing Provider  ALPRAZolam Duanne Moron) 0.5 MG tablet Take 0.5 mg by mouth at bedtime as needed for anxiety.   Yes [provider]  aspirin 81 MG chewable tablet Chew 81 mg by mouth daily.   Yes [provider]  Calcium Carb-Cholecalciferol (CALCIUM PLUS VITAMIN D3 PO) Take 1,200 mg by mouth daily.    Yes [provider]  citalopram (CELEXA) 20 MG tablet Take 20  mg by mouth daily.   Yes [provider]  ergocalciferol (VITAMIN D2) 50000 units capsule Take 50,000 Units by mouth once a week.   Yes [provider]  levothyroxine (SYNTHROID, LEVOTHROID) 100 MCG tablet Take 100 mcg by mouth daily before breakfast.   Yes [provider]    Family History    Family History  Problem Relation Age of Onset  . Heart attack Mother        MI @ 45, s/p CABG.  . Bladder Cancer Mother        died @ 37  . Breast cancer Mother   . Glaucoma Father   . Leukemia Father   . Heart  attack Sister        MI @ 81. s/p PPM    Social History    Social History   Social History  . Marital status: Married    Spouse name: N/A  . Number of children: N/A  . Years of education: N/A   Occupational History  . Not on file.   Social History Main Topics  . Smoking status: Current Every Day Smoker    Packs/day: 0.50    Years: 30.00    Types: Cigarettes  . Smokeless tobacco: Never Used  . Alcohol use No     Comment: rare  . Drug use: No  . Sexual activity: Not on file   Other Topics Concern  . Not on file   Social History Narrative   Lives in [place] with her husband. She does not routinely exercise.     Review of Systems    General:  No chills, fever, night sweats or weight changes.  Cardiovascular: +++ chest pain as outlined above - 1 isolated episode, no dyspnea on exertion, edema, orthopnea, palpitations, paroxysmal nocturnal dyspnea. Dermatological: No rash, lesions/masses Respiratory: No cough, dyspnea Urologic: No hematuria, dysuria Abdominal:   No nausea, vomiting, diarrhea, bright red blood per rectum, melena, or hematemesis Neurologic:  No visual changes, wkns, changes in mental status. All other systems reviewed and are otherwise negative except as noted above.  Physical Exam    VS:  BP 92/66 (BP Location: Right Arm, Patient Position: Sitting, Cuff Size: Normal)   Pulse 67   Ht 5' 2.5" (1.588 m)   Wt 153 lb 8 oz (69.6 kg)   BMI 27.63 kg/m  , BMI Body mass index is 27.63 kg/m. GEN: Well nourished, well developed, in no acute distress.  HEENT: normal.  Neck: Supple, no JVD, carotid bruits, or masses. Cardiac: RRR, no murmurs, rubs, or gallops. No clubbing, cyanosis, edema.  Radials/DP/PT 2+ and equal bilaterally.  Respiratory:  Respirations regular and unlabored, clear to auscultation bilaterally. GI: Soft, nontender, nondistended, BS + x 4. MS: no deformity or atrophy. Skin: warm and dry, no rash. Neuro:  Strength and sensation are  intact. Psych: Normal affect.  Accessory Clinical Findings    ECG - Regular sinus rhythm, 67, no acute ST or T changes.  Assessment & Plan   1.  Left chest pain: Patient had a brief episode of left breast/chest squeezing that occurred for about 5 minutes on August 30 and resolve spontaneously. She was seen in the emergency department with negative enzymes and normal ECG. She has had no recurrent chest pain since that episode. She does have a family history of premature coronary artery disease with her mother having an MI at age 53 and sister having MI at 65. She also had a cousin who died in his 110s  with an MI. In that setting, along with ongoing risk factors including tobacco abuse, I will arrange for an exercise treadmill test to screen for ischemia. Provided that this is normal, she can follow up with cardiology as needed. Next  2. Tobacco abuse: Complete cessation advised. She is not currently contemplating quitting.  3. Disposition: Exercise treadmill test as noted above. Further recommendations regarding follow-up following results.  Murray Hodgkins, NP 02/06/2017, 1:11 PM

## 2017-02-06 NOTE — Patient Instructions (Addendum)
Medication Instructions:  Please continue your current medications  Labwork: None  Testing/Procedures:  Your physician has requested that you have an exercise tolerance test.   1. You may have a light breakfast the morning of your procedure (or lunch if in the afternoon) 2. No caffeine for 24 hours prior to test 3. No smoking 24 hours prior to test. 4. Your medication may be taken with water.   5. Ladies, please do not wear dresses.  Skirts or pants are appropriate. Please wear a short sleeve shirt. 6. No perfume, cologne or lotion. 7. Wear comfortable walking shoes. No heels!  Follow-Up: Call or return to clinic prn if these symptoms worsen or fail to improve as anticipated.  If you need a refill on your cardiac medications before your next appointment, please call your pharmacy.   Cardiopulmonary Exercise Stress Test Cardiopulmonary exercise testing (CPET) is a test that checks how your heart and lungs react to exercise. This is called your exercise capacity. During this test, you will walk or run on a treadmill or pedal on a stationary bike while tests are done on your heart and lungs. You may have this test to:  See why you are short of breath.  Check for exercise intolerance.  See how your lungs work.  See how your heart works.  Check for how you are responding to a heart or lung rehabilitation program.  See if you have a heart or lung problem.  See if you are healthy enough to have surgery.  What happens before the procedure?  Follow instructions from your doctor about what you cannot eat or drink.  Ask your doctor about changing or stopping your normal medicines. This is important if you take diabetes medicines or blood thinners.  Wear loose, comfortable clothing and shoes.  If you use an inhaler, bring it with you to the test. What happens during the procedure?  A blood pressure cuff will be placed on your arm.  Several stick-on patches (electrodes) will  be placed on your chest. They will be attached to an electrocardiogram (EKG) machine.  A clip-on monitor that measures the amount of oxygen in your blood will be placed on your finger (pulse oximeter).  A clip will be placed on your nose and a mouthpiece will be placed in your mouth. This may be held in place with a headpiece. You will breathe through the mouthpiece during the test.  You will be asked to start exercising. You will be closely watched while you exercise.  The amount of effort for your exercise will be gradually increased.  During exercise, the test will measure: ? Your heart rate. ? Your heart rhythm. ? Your oxygen blood level. ? The amount of oxygen and carbon dioxide that you breathe out.  The test will end when: ? You have finished the test. ? You have reached your maximum ability to exercise. ? You have chest or leg pain, dizziness, or shortness of breath. The procedure may vary among doctors and hospitals. What happens after the procedure?  Your blood pressure and EKG will be checked to watch how you recover from the test. This information is not intended to replace advice given to you by your health care provider. Make sure you discuss any questions you have with your health care provider. Document Released: 04/12/2009 Document Revised: 09/14/2015 Document Reviewed: 03/08/2015 Elsevier Interactive Patient Education  2018 [employer].

## 2017-03-01 ENCOUNTER — Telehealth: Payer: Self-pay | Admitting: *Deleted

## 2017-03-01 NOTE — Telephone Encounter (Signed)
No answer. Left message with appointment date and time of stress test and to call back if she has any questions.

## 2017-03-05 ENCOUNTER — Ambulatory Visit (INDEPENDENT_AMBULATORY_CARE_PROVIDER_SITE_OTHER): Payer: BLUE CROSS/BLUE SHIELD

## 2017-03-05 DIAGNOSIS — R079 Chest pain, unspecified: Secondary | ICD-10-CM | POA: Diagnosis not present

## 2017-03-07 LAB — EXERCISE TOLERANCE TEST
CHL CUP RESTING HR STRESS: 69 {beats}/min
CSEPED: 7 min
CSEPEDS: 30 s
CSEPEW: 9.3 METS
MPHR: 156 {beats}/min
Peak HR: 139 {beats}/min
Percent HR: 89 %

## 2017-04-02 ENCOUNTER — Encounter: Payer: Self-pay | Admitting: General Surgery

## 2017-04-11 ENCOUNTER — Encounter: Payer: Self-pay | Admitting: General Surgery

## 2018-11-05 ENCOUNTER — Telehealth: Payer: Self-pay | Admitting: *Deleted

## 2018-11-05 ENCOUNTER — Other Ambulatory Visit: Payer: BLUE CROSS/BLUE SHIELD

## 2018-11-05 DIAGNOSIS — Z20822 Contact with and (suspected) exposure to covid-19: Secondary | ICD-10-CM

## 2018-11-05 NOTE — Telephone Encounter (Signed)
Alyssa Holland- Dr Ronnald Collum  Request testing for patient- she traveled to [place] for daughter's wedding and her husband has tested + COVID  Phone: [phone] Fax: [phone]

## 2018-11-09 ENCOUNTER — Telehealth: Payer: Self-pay

## 2018-11-09 LAB — NOVEL CORONAVIRUS, NAA: SARS-CoV-2, NAA: DETECTED — AB

## 2018-11-09 NOTE — Telephone Encounter (Signed)
LM on VM on [facility].  to call back to give pt information.

## 2018-11-09 NOTE — Telephone Encounter (Signed)
Called [facility] and spoke with [place] with pt information.

## 2018-11-09 NOTE — Telephone Encounter (Signed)
Left message on VM with covid + results, name,DOB,address, phone number and date of testing.

## 2019-05-07 ENCOUNTER — Ambulatory Visit: Payer: Medicare Other | Attending: Internal Medicine

## 2019-05-07 DIAGNOSIS — Z20822 Contact with and (suspected) exposure to covid-19: Secondary | ICD-10-CM

## 2019-05-09 LAB — NOVEL CORONAVIRUS, NAA: SARS-CoV-2, NAA: NOT DETECTED

## 2019-06-30 ENCOUNTER — Ambulatory Visit: Payer: Medicare Other | Attending: Internal Medicine

## 2019-06-30 ENCOUNTER — Other Ambulatory Visit: Payer: Self-pay

## 2019-06-30 DIAGNOSIS — Z23 Encounter for immunization: Secondary | ICD-10-CM | POA: Insufficient documentation

## 2019-06-30 NOTE — Progress Notes (Signed)
   Covid-19 Vaccination Clinic  Name:  Alyssa Holland    MRN: [id number] DOB: [birth year]  06/30/2019  Alyssa Holland was observed post Covid-19 immunization for 15 minutes without incidence. She was provided with Vaccine Information Sheet and instruction to access the V-Safe system.   Alyssa Holland was instructed to call 911 with any severe reactions post vaccine: Marland Kitchen Difficulty breathing  . Swelling of your face and throat  . A fast heartbeat  . A bad rash all over your body  . Dizziness and weakness    Immunizations Administered    Name Date Dose VIS Date Route   Pfizer COVID-19 Vaccine 06/30/2019 10:58 AM 0.3 mL 04/18/2019 Intramuscular   Manufacturer: [place]   Lot: [id number]   [place]: [id number]

## 2019-07-22 ENCOUNTER — Ambulatory Visit: Payer: Medicare Other | Attending: Internal Medicine

## 2019-07-22 DIAGNOSIS — Z23 Encounter for immunization: Secondary | ICD-10-CM

## 2019-07-22 NOTE — Progress Notes (Signed)
   Covid-19 Vaccination Clinic  Name:  Alyssa Holland    MRN: [id number] DOB: April 12, 1951  07/22/2019  [name] was observed post Covid-19 immunization for 15 minutes without incident. She was provided with Vaccine Information Sheet and instruction to access the V-Safe system.   [name] was instructed to call 911 with any severe reactions post vaccine: Marland Kitchen Difficulty breathing  . Swelling of face and throat  . A fast heartbeat  . A bad rash all over body  . Dizziness and weakness   Immunizations Administered    Name Date Dose VIS Date Route   Pfizer COVID-19 Vaccine 07/22/2019 11:07 AM 0.3 mL 04/18/2019 Intramuscular   Manufacturer: [place]   Lot: [id number]   [place]: [id number]

## 2019-12-31 ENCOUNTER — Ambulatory Visit: Payer: Medicare Other | Attending: Orthopedic Surgery | Admitting: Physical Therapy

## 2019-12-31 ENCOUNTER — Other Ambulatory Visit: Payer: Self-pay

## 2019-12-31 ENCOUNTER — Encounter: Payer: Self-pay | Admitting: Physical Therapy

## 2019-12-31 DIAGNOSIS — G8929 Other chronic pain: Secondary | ICD-10-CM | POA: Diagnosis present

## 2019-12-31 DIAGNOSIS — M25561 Pain in right knee: Secondary | ICD-10-CM | POA: Diagnosis present

## 2019-12-31 DIAGNOSIS — M25661 Stiffness of right knee, not elsewhere classified: Secondary | ICD-10-CM | POA: Insufficient documentation

## 2019-12-31 NOTE — Therapy (Signed)
[facility] [address], [place], [address] Phone: [phone]   Fax:  [phone]  Physical Therapy Evaluation  Patient Details  Name: Alyssa Holland MRN: [id number] Date of Birth: [birth year] No data recorded  Encounter Date: 12/31/2019    Past Medical History:  Diagnosis Date  . Chest pain    a. Reports prior nl stress tests w/ PCP - last 5+ yrs ago.  Marland Kitchen GAD (generalized anxiety disorder)   . GERD (gastroesophageal reflux disease)   . Iatrogenic hypothyroidism    a. s/p L thyroidectomy.  . Osteoporosis   . Papillary carcinoma of thyroid ([place])    a. 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  . Tobacco abuse   . Vitamin D deficiency     Past Surgical History:  Procedure Laterality Date  . CARPAL TUNNEL RELEASE Bilateral 1983  . COLONOSCOPY  2010   Normal  . COLONOSCOPY W/ POLYPECTOMY  10/06/2003   9 mm pedunculated tubular adenoma without atypia  . COLONOSCOPY WITH PROPOFOL N/A 12/19/2016   Procedure: COLONOSCOPY WITH PROPOFOL;  Surgeon: Robert Bellow, MD;  Location: [facility] ENDOSCOPY;  Service: Endoscopy;  Laterality: N/A;  . THYROID SURGERY Right     There were no vitals filed for this visit.     OBJECTIVE  MUSCULOSKELETAL: Tremor: Absent Bulk: Normal Tone: Normal, no spasticity, rigidity, or clonus No trophic changes noted to lower extremities. No ecchymosis, erythema, or edema noted around knee. No gross knee deformity noted  Posture No gross abnormalities noted in standing or seated posture  Lumbar/Hip AROM: WFL and painless with overpressure in all planes  Gait No gross deficits in gait identified  Palpation No pain to palpation along medial and lateral joint line of knee. No pain over patellar tendon. Mild tenderness to distal hamstring   Strength R/L 5/5 Hip flexion 4+/4+ Hip external rotation 5/5 Hip internal rotation 4/4 Hip  extension  4-/4 Hip abduction 5/5 Hip adduction 5/5 Knee extension 4+/4+ Knee flexion 5/5 Ankle Dorsiflexion 5/5 Ankle Plantarflexion 5/5 Ankle Inversion 5/5 Ankle Eversion *indicates pain  AROM All hip knee and ankle ROM WNL without pain  Muscle Length Hamstring length: degrees: shortened bilat Quad length Pat Patrick): WNL with tension at end range Hip Flexor length Marcello Moores):  IT band length Nicoletta Dress): WNL   Passive Accessory Motion Superior Tibiofibular Joint: WNL Knee: WNL bilat Patella: WNL bilat Ankle: WNL bilat  NEUROLOGICAL:  Mental Status Patient is oriented to person, place and time.  Recent memory is intact.  Remote memory is intact.  Attention span and concentration are intact.  Expressive speech is intact.  Patient's fund of knowledge is within normal limits for educational level.  Sensation Grossly intact to light touch bilateral LEs as determined by testing dermatomes L2-S2 Proprioception and hot/cold testing deferred on this date   VASCULAR Dorsalis pedis and posterior tibial pulses are palpable  SPECIAL TESTS Ligamentous Stability  Anterior Drawer Test: Negative bilat Lachman Test: Negative bilat Posterior Drawer Test: Negative bilat Posterior Sag Sign: Negative bilat Valgus Stress Test: Negative bilat Varus Stress Test: Negative bilat  Meniscus Tests McMurray Test: Negative bilat Noble Compression Test: Negative bilat Pivot-Shift Test: Negative bilat Thessaly Test: Negative bilat  SPECIAL TESTS Motor Control: Step down/up assessment: Min knee valgus Squat assessment: Increased LLE wt bearing, bilat knee valgus, bilat forefoot WB with increased ant tib translation SLS: R: 16sec L: 15sec 5xSTS 9sec    Ther-Ex PT reviewed the  following HEP with patient with patient able to demonstrate a set of the following with min cuing for correction needed. PT educated patient on parameters of therex (how/when to inc/decrease intensity, frequency, rep/set  range, stretch hold time, and purpose of therex) with verbalized understanding.  Access Code: [email or web address] Seated Hamstring Stretch - 3 x daily - 7 x weekly - 30-60sec hold Mini Squat with Chair - 1 x daily - 5 x weekly - 3 sets - 10 reps Supine Bridge - 1 x daily - 5 x weekly - 3 sets - 10 reps Prone Hip Extension with Bent Knee - 1 x daily - 5 x weekly - 3 sets - 10 reps                 Objective measurements completed on examination: See above findings.                             Patient will benefit from skilled therapeutic intervention in order to improve the following deficits and impairments:     Visit Diagnosis: No diagnosis found.     Problem List Patient Active Problem List   Diagnosis Date Noted  . History of colonic polyps 10/13/2016    Alyssa Holland 12/31/2019, 3:13 PM  [facility] [address] [place], [address] Phone: [phone]   Fax:  [phone]  Name: Alyssa Holland MRN: [id number] Date of Birth: May 14, 1950

## 2020-01-05 ENCOUNTER — Ambulatory Visit: Payer: Medicare Other | Admitting: Physical Therapy

## 2020-01-06 ENCOUNTER — Other Ambulatory Visit: Payer: Self-pay

## 2020-01-06 ENCOUNTER — Encounter: Payer: Self-pay | Admitting: Physical Therapy

## 2020-01-06 ENCOUNTER — Ambulatory Visit: Payer: Medicare Other | Admitting: Physical Therapy

## 2020-01-06 DIAGNOSIS — G8929 Other chronic pain: Secondary | ICD-10-CM

## 2020-01-06 DIAGNOSIS — M25661 Stiffness of right knee, not elsewhere classified: Secondary | ICD-10-CM

## 2020-01-06 DIAGNOSIS — M25561 Pain in right knee: Secondary | ICD-10-CM | POA: Diagnosis not present

## 2020-01-06 NOTE — Therapy (Signed)
[facility] [address], [place], [address] Phone: [phone]   Fax:  [phone]  Physical Therapy Treatment  Patient Details  Name: Alyssa Holland MRN: [id number] Date of Birth: [birth year] No data recorded  Encounter Date: 01/06/2020   PT End of Session - 01/06/20 0954    Visit Number 2    Number of Visits 13    Date for PT Re-Evaluation 02/13/20    PT Start Time 0948    PT Stop Time 1028    PT Time Calculation (min) 40 min    Activity Tolerance Patient tolerated treatment well    Behavior During Therapy [facility] for tasks assessed/performed           Past Medical History:  Diagnosis Date  . Chest pain    a. Reports prior nl stress tests w/ PCP - last 5+ yrs ago.  Marland Kitchen GAD (generalized anxiety disorder)   . GERD (gastroesophageal reflux disease)   . Iatrogenic hypothyroidism    a. s/p L thyroidectomy.  . Osteoporosis   . 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  . Tobacco abuse   . Vitamin D deficiency     Past Surgical History:  Procedure Laterality Date  . CARPAL TUNNEL RELEASE Bilateral 1983  . COLONOSCOPY  2010   Normal  . COLONOSCOPY W/ POLYPECTOMY  10/06/2003   9 mm pedunculated tubular adenoma without atypia  . COLONOSCOPY WITH PROPOFOL N/A 12/19/2016   Procedure: COLONOSCOPY WITH PROPOFOL;  Surgeon: Robert Bellow, MD;  Location: [facility] ENDOSCOPY;  Service: Endoscopy;  Laterality: N/A;  . THYROID SURGERY Right     There were no vitals filed for this visit.   Subjective Assessment - 01/06/20 0950    Subjective Patient reports her knee hasn't been too bad, some increased tension at backside of R knee, not really pain. Patient reports some compliance with HEP, reports she finds it hard to lay flat on her back, that it makes her feel naseaus.    Pertinent History Pt is a 69 year old female presenting with R  knee pain that began over a month ago following a trip to the beach. Reports it is feeling a little better, but does have some pain at post knee "hamstrings". Reports her pain is much less severe now, that she was having severe pain every morning but she started taking alieve for 2 weeks and now has minimal pain in the morning. Reports she has some posterior knee pain when she first wakes up, stands after prolonged sitting, and when she is going down steps (lives upstairs). Pain feels like a cramping sensation behind the knee; denies pins/needles or numbness, worst pain over the past week 1/10 best 0/10. She enjoys gardening and spending time with her grandchildren. Pt denies N/V, B&B changes, unexplained weight fluctuation, saddle paresthesia, fever, night sweats, or unrelenting night pain at this time.    Limitations House hold activities    How long can you sit comfortably? unilimited    How long can you stand comfortably? unlimited    How long can you walk comfortably? unlimited    Diagnostic tests Xray of knee reports MD said "knees look great"    Patient Stated Goals Increase exercise knowledge    Pain Onset More than a month ago             Ther-Ex Nustep L3 70mins  for gentle strengthening/endurance  Half kneeling rock into R hip/knee flex to ext 2-3sec hold x15 Supine hamstring curls theraball 2x 12 pt reports "this feels good" Bridge from theraball 2x 10 with min cuing initially for proper technique with good carry over Combined hamstring curl with maintained bridge 2x 10 with difficulty with stabilization  Prone hip ext with knee flex 2x 10 bilat (per pt request) with min cuing for glute activation with good carry over Mini squat to touch to chair x10; with 5# DB swing for increased hip ext 2x 10 with max cuing for technique with 50% carry over, difficulty not flexing with lumbar spine Seated Hamstring Stretch 40min hold                         PT Education -  01/06/20 0953    Education Details therex form/technique, HEP review    Person(s) Educated Patient    Methods Explanation;Demonstration;Verbal cues    Comprehension Verbalized understanding;Returned demonstration;Verbal cues required            PT Short Term Goals - 12/31/19 1556      PT SHORT TERM GOAL #1   Title Pt will be independent with HEP in order to decrease ankle pain and increase strength in order to improve pain-free function at home and work.    Baseline 12/31/19 HEP given    Time 4    Period Weeks    Status New             PT Long Term Goals - 12/31/19 1557      PT LONG TERM GOAL #1   Title Pt will increase hip ext and abd strength by at least 1/2 MMT grade in order to demonstrate improvement in strength and function    Baseline 12/31/19 hip abd 4 bilat; ext 4 bilat    Time 6    Period Weeks    Status New                 Plan - 01/06/20 1007    Clinical Impression Statement PT initiated therex progression for increased mobility and hamstring/glute strength with success. Patient is able to coomply with multi-modal cuing for proper technique of therex and proper muscle activation. PT reviewed HEP with patient, which patient is able to complete with accuracy following minor corrections. PT will continue progression as able.    Personal Factors and Comorbidities Age;Fitness;Past/Current Experience;Time since onset of injury/illness/exacerbation;Sex    Examination-Activity Limitations Squat;Transfers;Stairs;Stand    Examination-Participation Restrictions Community Activity;Laundry;Yard Work    [employer] officer Evolving/Moderate complexity    Clinical Decision Making Moderate    Rehab Potential Good    PT Frequency 2x / week    PT Duration 8 weeks    PT Treatment/Interventions Ultrasound;Gait training;Joint Manipulations;Spinal Manipulations;Dry needling;Passive range of motion;Manual techniques;Therapeutic exercise;Stair training;Functional  mobility training;Balance training;Patient/family education;Neuromuscular re-education;Therapeutic activities;Moist Heat;Electrical Stimulation;Cryotherapy;Iontophoresis 4mg /ml Dexamethasone;ADLs/Self Care Home Management    PT Next Visit Plan TND? squat form    PT Home Exercise Plan hamstring stretch, bridge, mini squat, prone hip ext    Consulted and Agree with Plan of Care Patient           Patient will benefit from skilled therapeutic intervention in order to improve the following deficits and impairments:  Decreased activity tolerance, Decreased endurance, Decreased range of motion, Decreased strength, Increased fascial restricitons, Decreased balance, Decreased mobility, Impaired flexibility, Impaired tone, Improper body mechanics, Pain, Postural dysfunction  Visit Diagnosis: Chronic  pain of right knee  Stiffness of right knee, not elsewhere classified     Problem List Patient Active Problem List   Diagnosis Date Noted  . History of colonic polyps 10/13/2016   [name] DPT [name] 01/06/2020, 10:37 AM  [facility] [address] [place], [address] Phone: [phone]   Fax:  [phone]  Name: Alyssa Holland MRN: [id number] Date of Birth: Jun 28, 1950

## 2020-01-08 ENCOUNTER — Ambulatory Visit: Payer: Medicare Other | Attending: Orthopedic Surgery | Admitting: Physical Therapy

## 2020-01-08 ENCOUNTER — Other Ambulatory Visit: Payer: Self-pay

## 2020-01-08 ENCOUNTER — Encounter: Payer: Self-pay | Admitting: Physical Therapy

## 2020-01-08 DIAGNOSIS — M25561 Pain in right knee: Secondary | ICD-10-CM | POA: Diagnosis present

## 2020-01-08 DIAGNOSIS — G8929 Other chronic pain: Secondary | ICD-10-CM | POA: Diagnosis present

## 2020-01-08 DIAGNOSIS — M25661 Stiffness of right knee, not elsewhere classified: Secondary | ICD-10-CM | POA: Diagnosis present

## 2020-01-08 NOTE — Therapy (Signed)
[place] [address] [place], [address] Phone: [phone]   Fax:  [phone]  Physical Therapy Treatment  Patient Details  Name: Alyssa Holland MRN: [id number] Date of Birth: [birth year] No data recorded  Encounter Date: 01/08/2020   PT End of Session - 01/08/20 0954    Visit Number 3    Number of Visits 13    Date for PT Re-Evaluation 02/13/20    PT Start Time 0949    PT Stop Time 1030    PT Time Calculation (min) 41 min    Activity Tolerance Patient tolerated treatment well    Behavior During Therapy [facility] for tasks assessed/performed           Past Medical History:  Diagnosis Date  . Chest pain    a. Reports prior nl stress tests w/ PCP - last 5+ yrs ago.  Marland Kitchen GAD (generalized anxiety disorder)   . GERD (gastroesophageal reflux disease)   . Iatrogenic hypothyroidism    a. s/p L thyroidectomy.  . Osteoporosis   . 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  . Tobacco abuse   . Vitamin D deficiency     Past Surgical History:  Procedure Laterality Date  . CARPAL TUNNEL RELEASE Bilateral 1983  . COLONOSCOPY  2010   Normal  . COLONOSCOPY W/ POLYPECTOMY  10/06/2003   9 mm pedunculated tubular adenoma without atypia  . COLONOSCOPY WITH PROPOFOL N/A 12/19/2016   Procedure: COLONOSCOPY WITH PROPOFOL;  Surgeon: Robert Bellow, MD;  Location: [facility] ENDOSCOPY;  Service: Endoscopy;  Laterality: N/A;  . THYROID SURGERY Right     There were no vitals filed for this visit.   Subjective Assessment - 01/08/20 0950    Subjective Patient reports she is doing well today. Reports her knee is doing well overall.    Pertinent History Pt is a 69 year old female presenting with R knee pain that began over a month ago following a trip to the beach. Reports it is feeling a little better, but does have some pain at post knee  "hamstrings". Reports her pain is much less severe now, that she was having severe pain every morning but she started taking alieve for 2 weeks and now has minimal pain in the morning. Reports she has some posterior knee pain when she first wakes up, stands after prolonged sitting, and when she is going down steps (lives upstairs). Pain feels like a cramping sensation behind the knee; denies pins/needles or numbness, worst pain over the past week 1/10 best 0/10. She enjoys gardening and spending time with her grandchildren. Pt denies N/V, B&B changes, unexplained weight fluctuation, saddle paresthesia, fever, night sweats, or unrelenting night pain at this time.    Limitations House hold activities    How long can you sit comfortably? unilimited    How long can you stand comfortably? unlimited    How long can you walk comfortably? unlimited    Diagnostic tests Xray of knee reports MD said "knees look great"    Patient Stated Goals Increase exercise knowledge    Pain Onset More than a month ago            Ther-Ex Nustep L3 60mins for gentle strengthening/endurance  Prone hip ext with knee flex x10 bilat (per pt request) with min cuing for glute activation with good carry over Qped hip ext 3x  10 bilat with heavy cuing for set up  Mini squat twith 10# DB swing for increased hip ext 3x 10 with max cuing for technique with some carry over, very difficult to complete with proper technique, 80% accuracy by final set MATRIX hip ext 55# 3x 10 RLE with demo and TC/VC needed to achieve proper technique and hip ext activation  Half kneeling rock into R hip/knee flex to ext 2-3sec hold x15 RLE OMEGA hamstring curl 15# x10; 25# 2x 10 with cuing for eccentric focus with good carry over Seated Hamstring Stretch 90min hold       PT Education - 01/08/20 0953    Education Details therex form/technique    Person(s) Educated Patient    Methods Explanation;Demonstration;Verbal cues    Comprehension Verbalized  understanding;Returned demonstration;Verbal cues required            PT Short Term Goals - 12/31/19 1556      PT SHORT TERM GOAL #1   Title Pt will be independent with HEP in order to decrease ankle pain and increase strength in order to improve pain-free function at home and work.    Baseline 12/31/19 HEP given    Time 4    Period Weeks    Status New             PT Long Term Goals - 12/31/19 1557      PT LONG TERM GOAL #1   Title Pt will increase hip ext and abd strength by at least 1/2 MMT grade in order to demonstrate improvement in strength and function    Baseline 12/31/19 hip abd 4 bilat; ext 4 bilat    Time 6    Period Weeks    Status New                 Plan - 01/08/20 1000    Clinical Impression Statement PT continued therex progression for increased mobility and hamstring/glute strength with good success. Patient with difficulty with core activation with hip motion, but is able to improve this throughout session. Patient is able to comply with multimodal cuing for proper technique of most therex with good motivation throughout session without increased pain. PT will continue progression as able.    Personal Factors and Comorbidities Age;Fitness;Past/Current Experience;Time since onset of injury/illness/exacerbation;Sex    Examination-Activity Limitations Squat;Transfers;Stairs;Stand    Examination-Participation Restrictions Community Activity;Laundry;Yard Work    [employer] officer Evolving/Moderate complexity    Clinical Decision Making Moderate    Rehab Potential Good    PT Frequency 2x / week    PT Duration 8 weeks    PT Treatment/Interventions Ultrasound;Gait training;Joint Manipulations;Spinal Manipulations;Dry needling;Passive range of motion;Manual techniques;Therapeutic exercise;Stair training;Functional mobility training;Balance training;Patient/family education;Neuromuscular re-education;Therapeutic activities;Moist Heat;Electrical  Stimulation;Cryotherapy;Iontophoresis 4mg /ml Dexamethasone;ADLs/Self Care Home Management    PT Next Visit Plan TND? squat form    PT Home Exercise Plan hamstring stretch, bridge, mini squat, prone hip ext    Consulted and Agree with Plan of Care Patient           Patient will benefit from skilled therapeutic intervention in order to improve the following deficits and impairments:  Decreased activity tolerance, Decreased endurance, Decreased range of motion, Decreased strength, Increased fascial restricitons, Decreased balance, Decreased mobility, Impaired flexibility, Impaired tone, Improper body mechanics, Pain, Postural dysfunction  Visit Diagnosis: Chronic pain of right knee  Stiffness of right knee, not elsewhere classified     Problem List Patient Active Problem List   Diagnosis Date Noted  . History  of colonic polyps 10/13/2016   [name] DPT [name] 01/08/2020, 10:27 AM  [facility] [address] [place], [address] Phone: [phone]   Fax:  [phone]  Name: Alyssa Holland MRN: [id number] Date of Birth: 01/14/1951

## 2020-01-09 ENCOUNTER — Ambulatory Visit: Payer: Medicare Other | Admitting: Physical Therapy

## 2020-01-13 ENCOUNTER — Other Ambulatory Visit: Payer: Self-pay

## 2020-01-13 ENCOUNTER — Encounter: Payer: Self-pay | Admitting: Physical Therapy

## 2020-01-13 ENCOUNTER — Ambulatory Visit: Payer: Medicare Other | Admitting: Physical Therapy

## 2020-01-13 DIAGNOSIS — M25561 Pain in right knee: Secondary | ICD-10-CM | POA: Diagnosis not present

## 2020-01-13 DIAGNOSIS — M25661 Stiffness of right knee, not elsewhere classified: Secondary | ICD-10-CM

## 2020-01-13 DIAGNOSIS — G8929 Other chronic pain: Secondary | ICD-10-CM

## 2020-01-13 NOTE — Therapy (Signed)
[facility] [address], [place], [address] Phone: [phone]   Fax:  [phone]  Physical Therapy Treatment/Discharge Summary Reporting Period 12/31/19 - 01/13/20  Patient Details  Name: Alyssa Holland MRN: [id number] Date of Birth: October 11, 1950 No data recorded  Encounter Date: 01/13/2020   PT End of Session - 01/13/20 1434    Visit Number 4    Number of Visits 13    Date for PT Re-Evaluation 02/13/20    PT Start Time 0230    PT Stop Time 0255    PT Time Calculation (min) 25 min    Activity Tolerance Patient tolerated treatment well    Behavior During Therapy [facility] for tasks assessed/performed           Past Medical History:  Diagnosis Date  . Chest pain    a. Reports prior nl stress tests w/ PCP - last 5+ yrs ago.  Marland Kitchen GAD (generalized anxiety disorder)   . GERD (gastroesophageal reflux disease)   . Iatrogenic hypothyroidism    a. s/p L thyroidectomy.  . Osteoporosis   . Papillary carcinoma of thyroid ([place])    a. 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  . Tobacco abuse   . Vitamin D deficiency     Past Surgical History:  Procedure Laterality Date  . CARPAL TUNNEL RELEASE Bilateral 1983  . COLONOSCOPY  2010   Normal  . COLONOSCOPY W/ POLYPECTOMY  10/06/2003   9 mm pedunculated tubular adenoma without atypia  . COLONOSCOPY WITH PROPOFOL N/A 12/19/2016   Procedure: COLONOSCOPY WITH PROPOFOL;  Surgeon: Robert Bellow, MD;  Location: [facility] ENDOSCOPY;  Service: Endoscopy;  Laterality: N/A;  . THYROID SURGERY Right     There were no vitals filed for this visit.   Subjective Assessment - 01/13/20 1433    Subjective Patient reports she has had no pain over the weekend. Is doing very well and ready to d/c PT.    Pertinent History Pt is a 69 year old female presenting with R knee pain that began over a month ago following a trip to the beach. Reports it is feeling  a little better, but does have some pain at post knee "hamstrings". Reports her pain is much less severe now, that she was having severe pain every morning but she started taking alieve for 2 weeks and now has minimal pain in the morning. Reports she has some posterior knee pain when she first wakes up, stands after prolonged sitting, and when she is going down steps (lives upstairs). Pain feels like a cramping sensation behind the knee; denies pins/needles or numbness, worst pain over the past week 1/10 best 0/10. She enjoys gardening and spending time with her grandchildren. Pt denies N/V, B&B changes, unexplained weight fluctuation, saddle paresthesia, fever, night sweats, or unrelenting night pain at this time.    Limitations House hold activities    How long can you sit comfortably? unilimited    How long can you stand comfortably? unlimited    How long can you walk comfortably? unlimited    Diagnostic tests Xray of knee reports MD said "knees look great"    Patient Stated Goals Increase exercise knowledge    Pain Onset More than a month ago          Ther-Ex Nustep L3 78mins for gentle strengthening/endurance  PT reviewed the following HEP with patient with patient able to demonstrate a set of the  following with min cuing for correction needed. PT educated patient on parameters of therex (how/when to inc/decrease intensity, frequency, rep/set range, stretch hold time, and purpose of therex) with verbalized understanding.  Access Code: [email or web address] Single Leg Bridge - 1 x daily - 2-3 x weekly - 3 sets - 10 reps Mini Squat with Chair - 1 x daily - 2-3 x weekly - 3 sets - 10 reps Reverse Lunge - 1 x daily - 2-3 x weekly - 3 sets - 10 reps Prone Hip Extension with Resistance Loop - 1 x daily - 2-3 x weekly - 3 sets - 10 reps Seated Hamstring Stretch - 2 x daily - 7 x weekly - 60sec hold                             PT Education - 01/13/20 1434    Education Details d/c  recommendations/HEP    Person(s) Educated Patient    Methods Explanation;Demonstration;Verbal cues    Comprehension Verbalized understanding;Returned demonstration;Verbal cues required            PT Short Term Goals - 01/13/20 1435      PT SHORT TERM GOAL #1   Title Pt will be independent with HEP in order to decrease ankle pain and increase strength in order to improve pain-free function at home and work.    Baseline 12/31/19 HEP given; 01/13/20 Completing without quesiton or concern    Time 4    Period Weeks    Status New             PT Long Term Goals - 01/13/20 1435      PT LONG TERM GOAL #1   Title Pt will increase hip ext and abd strength by at least 1/2 MMT grade in order to demonstrate improvement in strength and function    Baseline 12/31/19 hip abd 4 bilat; ext 4 bilat; 01/13/20 5/5 gross bilat hip strength    Time 6    Period Weeks    Status Achieved                 Plan - 01/13/20 1458    Clinical Impression Statement PT reassessed goals this session to safely d/c pt to HEP in lieu of pain. Patient is able to demonstrate and verbalize understanding of all HEP recommendations. Pt given clinic contact info should any further questions or concerns arise.    Personal Factors and Comorbidities Age;Fitness;Past/Current Experience;Time since onset of injury/illness/exacerbation;Sex    Examination-Activity Limitations Squat;Transfers;Stairs;Stand    Examination-Participation Restrictions Community Activity;Laundry;Yard Work    [employer] Evolving/Moderate complexity    Clinical Decision Making Moderate    Rehab Potential Good    PT Frequency 2x / week    PT Duration 8 weeks    PT Treatment/Interventions Ultrasound;Gait training;Joint Manipulations;Spinal Manipulations;Dry needling;Passive range of motion;Manual techniques;Therapeutic exercise;Stair training;Functional mobility training;Balance training;Patient/family education;Neuromuscular  re-education;Therapeutic activities;Moist Heat;Electrical Stimulation;Cryotherapy;Iontophoresis 4mg /ml Dexamethasone;ADLs/Self Care Home Management    PT Next Visit Plan d/c pt    PT Home Exercise Plan hamstring stretch, bridge, mini squat, prone hip ext    Consulted and Agree with Plan of Care Patient           Patient will benefit from skilled therapeutic intervention in order to improve the following deficits and impairments:  Decreased activity tolerance, Decreased endurance, Decreased range of motion, Decreased strength, Increased fascial restricitons, Decreased balance, Decreased mobility, Impaired flexibility, Impaired tone, Improper body mechanics, Pain,  Postural dysfunction  Visit Diagnosis: Chronic pain of right knee  Stiffness of right knee, not elsewhere classified     Problem List Patient Active Problem List   Diagnosis Date Noted  . History of colonic polyps 10/13/2016   [name] DPT [name] 01/13/2020, 3:01 PM  [facility] [address], [place], [address] Phone: [phone]   Fax:  [phone]  Name: Alyssa Holland MRN: [id number] Date of Birth: Nov 25, 1950

## 2020-01-16 ENCOUNTER — Ambulatory Visit: Payer: Medicare Other | Admitting: Physical Therapy

## 2020-01-20 ENCOUNTER — Ambulatory Visit: Payer: Medicare Other | Admitting: Physical Therapy

## 2020-01-22 ENCOUNTER — Ambulatory Visit: Payer: Medicare Other | Admitting: Physical Therapy

## 2020-01-26 ENCOUNTER — Encounter: Payer: Medicare Other | Admitting: Physical Therapy

## 2020-01-30 ENCOUNTER — Encounter: Payer: Medicare Other | Admitting: Physical Therapy

## 2020-02-02 ENCOUNTER — Encounter: Payer: Medicare Other | Admitting: Physical Therapy

## 2020-02-04 ENCOUNTER — Encounter: Payer: Medicare Other | Admitting: Physical Therapy

## 2020-02-09 ENCOUNTER — Encounter: Payer: Medicare Other | Admitting: Physical Therapy

## 2020-02-10 ENCOUNTER — Encounter: Payer: Medicare Other | Admitting: Physical Therapy

## 2020-02-11 ENCOUNTER — Encounter: Payer: Medicare Other | Admitting: Physical Therapy

## 2020-02-13 ENCOUNTER — Encounter: Payer: Medicare Other | Admitting: Physical Therapy

## 2020-02-16 ENCOUNTER — Encounter: Payer: Medicare Other | Admitting: Physical Therapy

## 2020-02-18 ENCOUNTER — Encounter: Payer: Medicare Other | Admitting: Physical Therapy

## 2020-02-23 ENCOUNTER — Encounter: Payer: Medicare Other | Admitting: Physical Therapy

## 2020-02-25 ENCOUNTER — Encounter: Payer: Medicare Other | Admitting: Physical Therapy

## 2020-03-01 ENCOUNTER — Ambulatory Visit: Payer: Medicare Other | Attending: Internal Medicine

## 2020-03-01 ENCOUNTER — Encounter: Payer: Medicare Other | Admitting: Physical Therapy

## 2020-03-01 DIAGNOSIS — Z23 Encounter for immunization: Secondary | ICD-10-CM

## 2020-03-01 NOTE — Progress Notes (Signed)
   Covid-19 Vaccination Clinic  Name:  Alyssa Holland    MRN: [id number] DOB: [birth year]  03/01/2020  Alyssa Holland was observed post Covid-19 immunization for 15 minutes without incident. She was provided with Vaccine Information Sheet and instruction to access the V-Safe system.   Alyssa Holland was instructed to call 911 with any severe reactions post vaccine: Marland Kitchen Difficulty breathing  . Swelling of face and throat  . A fast heartbeat  . A bad rash all over body  . Dizziness and weakness

## 2020-03-03 ENCOUNTER — Encounter: Payer: Medicare Other | Admitting: Physical Therapy

## 2021-07-29 ENCOUNTER — Other Ambulatory Visit: Payer: Self-pay | Admitting: General Surgery

## 2021-07-29 NOTE — Progress Notes (Signed)
Subjective:  ?  ? Patient ID: Alyssa Holland is a 71 y.o. female. ?  ?HPI ?  ?The following portions of the patient's history were reviewed and updated as appropriate. ?  ?This a new patient is here today for: office visit. Here for evaluation of difficulty swallowing. ?She states the swallowing has gotten worse over the past 2 years. She states food seems to "hang" and is worse with biscuits, bread and hamburger. No trouble swallowing medications.  Significant acceleration of symptoms in the last 2 months.  No weight loss.  Patient denies reflux. ?Sometimes half way through a meal she has to get up to vomit food that is "stuck". ?  ?When she first developed vague upper abdominal symptoms 2015-2018 she noticed increased belching after meals but this markedly improved with the institution of omeprazole.  Presently using this medication infrequently. ?  ?. ?Bowels move every other day. ?  ?She is here with her husband, Josph Macho. ?  ?Review of Systems  ?Constitutional: Negative for chills and fever.  ?Respiratory: Negative for cough.   ?  ?  ?   ?Chief Complaint  ?Patient presents with  ? New Patient  ?  ?  ?BP 100/56   Pulse 81   Temp 36.5 ?C (97.7 ?F)   Ht 158.8 cm (5' 2.5")   Wt 73.5 kg (162 lb)   LMP  (LMP Unknown)   SpO2 96%   BMI 29.16 kg/m?  ?  ?    ?Past Medical History:  ?Diagnosis Date  ? Anxiety    ? Chest pain    ? Depression    ? GERD (gastroesophageal reflux disease)    ? Hypothyroid, unspecified    ? Osteopenia    ? Osteoporosis    ? Papillary carcinoma of thyroid ([facility])    ? Vitamin D deficiency    ?  ?  ?     ?Past Surgical History:  ?Procedure Laterality Date  ? COLONOSCOPY   08/06/2003  ?  Dr Bary Castilla  ? COLONOSCOPY   08/11/2008  ?  Dr Bary Castilla  ? COLONOSCOPY   12/19/2016  ?  Dr Bary Castilla  ? INCISION TENDON SHEATH FOR TRIGGER FINGER Right 05/14/2020  ?  Dr. Rudene Christians (Thumb)  ? Bilateral Carpal Tunnel Release Bilateral    ? Bilateral Trigger Finger Release Bilateral    ?  Cannot remember which  fingers  ? partial thyroidectomy      ?  ?  ?  ?OB History   ?No obstetric history on file. ?  ?  Obstetric Comments  ?Age at first period ?Age of first pregnancy ?   ?  ?   ?  ?  ?Social History  ?  ?  ?     ?Socioeconomic History  ? Marital status: Married  ?Tobacco Use  ? Smoking status: Some Days  ?    Packs/day: 0.50  ?    Years: 30.00  ?    Pack years: 15.00  ?    Types: Cigarettes  ? Smokeless tobacco: Never  ?Vaping Use  ? Vaping Use: Never used  ?Substance and Sexual Activity  ? Alcohol use: Yes  ?    Comment: Rare  ? Drug use: Defer  ? Sexual activity: Defer  ?  ?  ?  ?    ?Allergies  ?Allergen Reactions  ? Codeine Nausea and Vomiting  ?  ?  ?Current Medications  ?      ?Current Outpatient Medications  ?Medication Sig Dispense  Refill  ? ALPRAZolam (XANAX) 0.5 MG tablet Take 1 tablet (0.5 mg total) by mouth at bedtime as needed      ? calcium carbonate 500 mg calcium (1,250 mg) tablet Take 1 tablet (1,250 mg of salt total) by mouth once daily      ? citalopram (CELEXA) 20 MG tablet Take 1 tablet (20 mg total) by mouth once daily      ? ergocalciferol, vitamin D2, 50,000 unit capsule Take 1 capsule (50,000 Units total) by mouth once a week      ? levothyroxine (SYNTHROID) 88 MCG tablet Take 1 tablet (88 mcg total) by mouth once daily Take on an empty stomach with a glass of water at least 30-60 minutes before breakfast.      ? omeprazole (PRILOSEC) 40 MG DR capsule Take 1 capsule (40 mg total) by mouth once daily      ?  ?No current facility-administered medications for this visit.  ?  ?  ?  ?     ?Family History  ?Problem Relation Age of Onset  ? Breast cancer Mother    ? Myocardial Infarction (Heart attack) Mother    ? Bladder Cancer Mother    ? Glaucoma Father    ? Leukemia Father    ? Myocardial Infarction (Heart attack) Sister    ?  ?  ?  ?Labs and Radiology:  ?  ?Colonoscopy history: ?  ?Tubular adenoma in 2005.  Normal exams in 2010 and 2018.  Repeat due 2028. ?  ?  ?No prior upper endoscopies. ?  ?   ?  ?   ?Objective:  ? Physical Exam ?Constitutional:   ?   Appearance: Normal appearance.  ?Neck:  ?   Thyroid: No thyroid mass or thyromegaly.  ? ?Cardiovascular:  ?   Rate and Rhythm: Normal rate and regular rhythm.  ?   Pulses: Normal pulses.  ?   Heart sounds: Normal heart sounds.  ?Pulmonary:  ?   Effort: Pulmonary effort is normal.  ?   Breath sounds: Normal breath sounds.  ?Musculoskeletal:  ?   Cervical back: Neck supple.  ?Lymphadenopathy:  ?   Cervical: No cervical adenopathy.  ?Skin: ?   General: Skin is warm and dry.  ?Neurological:  ?   Mental Status: She is alert and oriented to person, place, and time.  ?Psychiatric:     ?   Mood and Affect: Mood normal.     ?   Behavior: Behavior normal.  ?  ?  ?  ?   ?Assessment:  ?   ?Dysphagia. ?   ?Plan:  ?   ?Indication for upper endoscopy reviewed.  Possibility of dilatation discussed.  Risk associated with dilatation reviewed. ?   ?  ?This note is partially prepared by Karie Fetch, RN, acting as a scribe in the presence of Dr. Hervey Ard, MD.  ?The documentation recorded by the scribe accurately reflects the service I personally performed and the decisions made by me.  ?  ?Robert Bellow, MD FACS ?

## 2021-08-16 ENCOUNTER — Encounter: Payer: Self-pay | Admitting: General Surgery

## 2021-08-17 ENCOUNTER — Ambulatory Visit: Payer: Medicare Other | Admitting: Anesthesiology

## 2021-08-17 ENCOUNTER — Other Ambulatory Visit: Payer: Self-pay | Admitting: General Surgery

## 2021-08-17 ENCOUNTER — Ambulatory Visit
Admission: RE | Admit: 2021-08-17 | Discharge: 2021-08-17 | Disposition: A | Discharge status: Home / Self Care | Payer: Medicare Other | Attending: General Surgery | Admitting: General Surgery

## 2021-08-17 ENCOUNTER — Encounter: Payer: Self-pay | Admitting: General Surgery

## 2021-08-17 ENCOUNTER — Encounter
Admission: RE | Disposition: A | Discharge status: Home / Self Care | Payer: Self-pay | Source: Home / Self Care | Attending: General Surgery

## 2021-08-17 DIAGNOSIS — Z79899 Other long term (current) drug therapy: Secondary | ICD-10-CM | POA: Insufficient documentation

## 2021-08-17 DIAGNOSIS — K219 Gastro-esophageal reflux disease without esophagitis: Secondary | ICD-10-CM | POA: Insufficient documentation

## 2021-08-17 DIAGNOSIS — Z79890 Hormone replacement therapy (postmenopausal): Secondary | ICD-10-CM | POA: Diagnosis not present

## 2021-08-17 DIAGNOSIS — F411 Generalized anxiety disorder: Secondary | ICD-10-CM | POA: Insufficient documentation

## 2021-08-17 DIAGNOSIS — K449 Diaphragmatic hernia without obstruction or gangrene: Secondary | ICD-10-CM | POA: Insufficient documentation

## 2021-08-17 DIAGNOSIS — Z7982 Long term (current) use of aspirin: Secondary | ICD-10-CM | POA: Insufficient documentation

## 2021-08-17 DIAGNOSIS — F32A Depression, unspecified: Secondary | ICD-10-CM | POA: Diagnosis not present

## 2021-08-17 DIAGNOSIS — R1310 Dysphagia, unspecified: Secondary | ICD-10-CM | POA: Insufficient documentation

## 2021-08-17 DIAGNOSIS — F17210 Nicotine dependence, cigarettes, uncomplicated: Secondary | ICD-10-CM | POA: Diagnosis not present

## 2021-08-17 HISTORY — DX: Other specified disorders of bone density and structure, unspecified site: M85.80

## 2021-08-17 HISTORY — DX: Depression, unspecified: F32.A

## 2021-08-17 HISTORY — PX: ESOPHAGOGASTRODUODENOSCOPY (EGD) WITH PROPOFOL: SHX5813

## 2021-08-17 SURGERY — ESOPHAGOGASTRODUODENOSCOPY (EGD) WITH PROPOFOL
Anesthesia: General

## 2021-08-17 MED ORDER — LIDOCAINE HCL (CARDIAC) PF 100 MG/5ML IV SOSY
PREFILLED_SYRINGE | INTRAVENOUS | Status: DC | PRN
  Administered 2021-08-17: 100 mg via INTRAVENOUS

## 2021-08-17 MED ORDER — GLYCOPYRROLATE 0.2 MG/ML IJ SOLN
INTRAMUSCULAR | Status: DC | PRN
  Administered 2021-08-17: .2 mg via INTRAVENOUS

## 2021-08-17 MED ORDER — PROPOFOL 10 MG/ML IV BOLUS
INTRAVENOUS | Status: DC | PRN
  Administered 2021-08-17: 60 mg via INTRAVENOUS
  Administered 2021-08-17 (×2): 10 mg via INTRAVENOUS

## 2021-08-17 MED ORDER — PROPOFOL 500 MG/50ML IV EMUL
INTRAVENOUS | Status: DC | PRN
  Administered 2021-08-17: 145 ug/kg/min via INTRAVENOUS

## 2021-08-17 MED ORDER — SODIUM CHLORIDE 0.9 % IV SOLN: INTRAVENOUS | Status: DC

## 2021-08-17 NOTE — Op Note (Signed)
[facility] ?Gastroenterology ?Patient Name: Alyssa Holland ?Procedure Date: 08/17/2021 10:53 AM ?MRN: [id number] ?Account #: [id number] ?Date of Birth: [birth year] ?Admit Type: Outpatient ?Age: 71 ?Room: [facility] ENDO ROOM 1 ?Gender: Female ?Note Status: Finalized ?Instrument Name: Upper-Endoscope [id number] ?Procedure:             Upper GI endoscopy ?Indications:           Dysphagia ?Providers:             Robert Bellow, MD ?Medicines:             Propofol per Anesthesia ?Complications:         No immediate complications. ?Procedure:             Pre-Anesthesia Assessment: ?                       - Prior to the procedure, a History and Physical was  ?                       performed, and patient medications, allergies and  ?                       sensitivities were reviewed. The patient's tolerance  ?                       of previous anesthesia was reviewed. ?                       - The risks and benefits of the procedure and the  ?                       sedation options and risks were discussed with the  ?                       patient. All questions were answered and informed  ?                       consent was obtained. ?                       After obtaining informed consent, the endoscope was  ?                       passed under direct vision. Throughout the procedure,  ?                       the patient's blood pressure, pulse, and oxygen  ?                       saturations were monitored continuously. The Endoscope  ?                       was introduced through the mouth, and advanced to the  ?                       second part of duodenum. The upper GI endoscopy was  ?                       accomplished without difficulty. The patient tolerated  ?  the procedure well. ?Findings: ?     A medium-sized hiatal hernia was present. ?     The stomach was normal. ?     The examined duodenum was normal. ?Impression:            - Medium-sized hiatal hernia. ?                        - Normal stomach. ?                       - Normal examined duodenum. ?                       - No specimens collected. ?Recommendation:        - Discharge patient to home (via wheelchair). ?Procedure Code(s):     --- Professional --- ?                       [phone], Esophagogastroduodenoscopy, flexible,  ?                       transoral; diagnostic, including collection of  ?                       specimen(s) by brushing or washing, when performed  ?                       (separate procedure) ?Diagnosis Code(s):     --- Professional --- ?                       K44.9, Diaphragmatic hernia without obstruction or  ?                       gangrene ?                       R13.10, Dysphagia, unspecified ?CPT copyright 2019 American Medical Association. All rights reserved. ?The codes documented in this report are preliminary and upon coder review may  ?be revised to meet current compliance requirements. ?Robert Bellow, MD ?08/17/2021 11:17:03 AM ?This report has been signed electronically. ?Number of Addenda: 0 ?Note Initiated On: 08/17/2021 10:53 AM ?Estimated Blood Loss:  Estimated blood loss: none. ?     [facility] ?

## 2021-08-17 NOTE — Transfer of Care (Signed)
Immediate Anesthesia Transfer of Care Note ? ?Patient: Alyssa Holland ? ?Procedure(s) Performed: ESOPHAGOGASTRODUODENOSCOPY (EGD) WITH PROPOFOL ? ?Patient Location: Endoscopy Unit ? ?Anesthesia Type:General ? ?Level of Consciousness: drowsy and patient cooperative ? ?Airway & Oxygen Therapy: Patient Spontanous Breathing and Patient connected to face mask oxygen ? ?Post-op Assessment: Report given to RN and Post -op Vital signs reviewed and stable ? ?Post vital signs: Reviewed and stable ? ?Last Vitals:  ?Vitals Value Taken Time  ?BP 93/59 08/17/21 1118  ?Temp 36.1 ?C 08/17/21 1118  ?Pulse 63 08/17/21 1120  ?Resp 14 08/17/21 1120  ?SpO2 100 % 08/17/21 1120  ?Vitals shown include unvalidated device data. ? ?Last Pain:  ?Vitals:  ? 08/17/21 1118  ?TempSrc: Temporal  ?PainSc: Asleep  ?   ? ?  ? ?Complications: No notable events documented. ?

## 2021-08-17 NOTE — H&P (Signed)
Alyssa Holland ?[id number] ?July 06, 1950 ? ?  ? ?HPI: Dysphagia, for EGD. ? ?Medications Prior to Admission  ?Medication Sig Dispense Refill Last Dose  ? ALPRAZolam (XANAX) 0.5 MG tablet Take 0.5 mg by mouth at bedtime as needed for anxiety.   Past Month  ? Calcium Carb-Cholecalciferol (CALCIUM PLUS VITAMIN D3 PO) Take 1,200 mg by mouth daily.    08/16/2021  ? citalopram (CELEXA) 20 MG tablet Take 20 mg by mouth daily.   08/16/2021  ? ergocalciferol (VITAMIN D2) 50000 units capsule Take 50,000 Units by mouth once a week.   Past Week  ? levothyroxine (SYNTHROID, LEVOTHROID) 100 MCG tablet Take 100 mcg by mouth daily before breakfast.   08/16/2021  ? omeprazole (PRILOSEC) 40 MG capsule Take 40 mg by mouth daily.   08/15/2021  ? aspirin 81 MG chewable tablet Chew 81 mg by mouth daily. (Patient not taking: Reported on 08/17/2021)   Not Taking  ? ?Allergies  ?Allergen Reactions  ? Codeine Nausea And Vomiting  ?  Per Dahlia Bailiff, RN  ? ?Past Medical History:  ?Diagnosis Date  ? Chest pain   ? a. Reports prior nl stress tests w/ PCP - last 5+ yrs ago.  ? Depression   ? GAD (generalized anxiety disorder)   ? GERD (gastroesophageal reflux disease)   ? Iatrogenic hypothyroidism   ? a. s/p L thyroidectomy.  ? Osteopenia   ? Osteoporosis   ? Papillary carcinoma of thyroid ([place])   ? a. 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  ? Tobacco abuse   ? Vitamin D deficiency   ? ?Past Surgical History:  ?Procedure Laterality Date  ? CARPAL TUNNEL RELEASE Bilateral 1983  ? COLONOSCOPY  2010  ? Normal  ? COLONOSCOPY W/ POLYPECTOMY  10/06/2003  ? 9 mm pedunculated tubular adenoma without atypia  ? COLONOSCOPY WITH PROPOFOL N/A 12/19/2016  ? Procedure: COLONOSCOPY WITH PROPOFOL;  Surgeon: Robert Bellow, MD;  Location: [facility] ENDOSCOPY;  Service: Endoscopy;  Laterality: N/A;  ? THYROID SURGERY Right   ? ?Social History  ? ?Socioeconomic History  ? Marital status: Married  ?  Spouse name: Not on file  ? Number of  children: Not on file  ? Years of education: Not on file  ? Highest education level: Not on file  ?Occupational History  ? Not on file  ?Tobacco Use  ? Smoking status: Some Days  ?  Packs/day: 0.50  ?  Years: 30.00  ?  Pack years: 15.00  ?  Types: Cigarettes  ? Smokeless tobacco: Never  ?Vaping Use  ? Vaping Use: Never used  ?Substance and Sexual Activity  ? Alcohol use: No  ?  Comment: rare  ? Drug use: No  ? Sexual activity: Not on file  ?Other Topics Concern  ? Not on file  ?Social History Narrative  ? Lives in [place] with her husband. She does not routinely exercise.  ? ?Social Determinants of Health  ? ?Financial Resource Strain: Not on file  ?Food Insecurity: Not on file  ?Transportation Needs: Not on file  ?Physical Activity: Not on file  ?Stress: Not on file  ?Social Connections: Not on file  ?Intimate Partner Violence: Not on file  ? ?Social History  ? ?Social History Narrative  ? Lives in [place] with her husband. She does not routinely exercise.  ? ? ? ?ROS: Negative.  ? ? ? ?PE: ?HEENT: Negative. ?Lungs: Clear. ?Cardio: RR. ? ?Assessment/Plan: ? ?Proceed with planned endoscopy.   ?  [name] ?08/17/2021 ? ? ?  ?

## 2021-08-17 NOTE — Anesthesia Procedure Notes (Signed)
Procedure Name: General with mask airway ?Date/Time: 08/17/2021 11:06 AM ?Performed by: Kelton Pillar, CRNA ?Pre-anesthesia Checklist: Patient identified, Emergency Drugs available, Suction available and Patient being monitored ?Patient Re-evaluated:Patient Re-evaluated prior to induction ?Oxygen Delivery Method: Simple face mask ?Induction Type: IV induction ?Placement Confirmation: positive ETCO2 and CO2 detector ?Dental Injury: Teeth and Oropharynx as per pre-operative assessment  ? ? ? ? ?

## 2021-08-17 NOTE — Anesthesia Postprocedure Evaluation (Signed)
Anesthesia Post Note ? ?Patient: Alyssa Holland ? ?Procedure(s) Performed: ESOPHAGOGASTRODUODENOSCOPY (EGD) WITH PROPOFOL ? ?Patient location during evaluation: Endoscopy ?Anesthesia Type: General ?Level of consciousness: awake and alert ?Pain management: pain level controlled ?Vital Signs Assessment: post-procedure vital signs reviewed and stable ?Respiratory status: spontaneous breathing, nonlabored ventilation, respiratory function stable and patient connected to nasal cannula oxygen ?Cardiovascular status: blood pressure returned to baseline and stable ?Postop Assessment: no apparent nausea or vomiting ?Anesthetic complications: no ? ? ?No notable events documented. ? ? ?Last Vitals:  ?Vitals:  ? 08/17/21 0951 08/17/21 1118  ?BP: 132/81 (!) 93/59  ?Pulse: 76   ?Resp: 18   ?Temp: (!) 36.2 ?C (!) 36.1 ?C  ?SpO2: 97%   ?  ?Last Pain:  ?Vitals:  ? 08/17/21 1138  ?TempSrc:   ?PainSc: 0-No pain  ? ? ?  ?  ?  ?  ?  ?  ? ?Martha Clan ? ? ? ? ?

## 2021-08-17 NOTE — Anesthesia Preprocedure Evaluation (Signed)
Anesthesia Evaluation  ?Patient identified by MRN, date of birth, ID band ?Patient awake ? ? ? ?Reviewed: ?Allergy & Precautions, NPO status , Patient's Chart, lab work & pertinent test results ? ?History of Anesthesia Complications ?Negative for: history of anesthetic complications ? ?Airway ?Mallampati: II ? ?TM Distance: >3 FB ? ? ? ? Dental ?no notable dental hx. ?(+) Dental Advidsory Given, Caps, Teeth Intact ?  ?Pulmonary ?neg shortness of breath, neg COPD, neg recent URI, Current Smoker and Patient abstained from smoking.,  ?  ?Pulmonary exam normal ? ? ? ? ? ? ? Cardiovascular ?negative cardio ROS ?Normal cardiovascular exam ? ? ?  ?Neuro/Psych ?PSYCHIATRIC DISORDERS Anxiety Depression negative neurological ROS ?   ? GI/Hepatic ?Neg liver ROS, GERD  Medicated,  ?Endo/Other  ?neg diabetesHypothyroidism  ? Renal/GU ?negative Renal ROS  ?negative genitourinary ?  ?Musculoskeletal ?negative musculoskeletal ROS ?(+)  ? Abdominal ?Normal abdominal exam  (+)   ?Peds ?negative pediatric ROS ?(+)  Hematology ?negative hematology ROS ?(+)   ?Anesthesia Other Findings ?Past Medical History: ?No date: Chest pain ?    Comment:  a. Reports prior nl stress tests w/ PCP - last 5+ yrs  ?             ago. ?No date: Depression ?No date: GAD (generalized anxiety disorder) ?No date: GERD (gastroesophageal reflux disease) ?No date: Iatrogenic hypothyroidism ?    Comment:  a. s/p L thyroidectomy. 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    Comment:  a. 02/2008 -  papillary adenocarcinoma of L thyroid lobe ?             s/p L thyroidectomy. ?No date: Tobacco abuse ?No date: Vitamin D deficiency ? ? Reproductive/Obstetrics ?negative OB ROS ? ?  ? ? ? ? ? ? ? ? ? ? ? ? ? ?  ?  ? ? ? ? ? ? ? ? ?Anesthesia Physical ? ?Anesthesia Plan ? ?ASA: 2 ? ?Anesthesia Plan: General  ? ?Post-op Pain Management:   ? ?Induction: Intravenous ? ?PONV Risk Score and  Plan: 2 and Propofol infusion and TIVA ? ?Airway Management Planned: Nasal Cannula and Natural Airway ? ?Additional Equipment:  ? ?Intra-op Plan:  ? ?Post-operative Plan:  ? ?Informed Consent: I have reviewed the patients History and Physical, chart, labs and discussed the procedure including the risks, benefits and alternatives for the proposed anesthesia with the patient or authorized representative who has indicated his/her understanding and acceptance.  ? ? ? ?Dental advisory given ? ?Plan Discussed with: CRNA and Surgeon ? ?Anesthesia Plan Comments:   ? ? ? ? ? ? ?Anesthesia Quick Evaluation ? ?

## 2021-08-18 ENCOUNTER — Encounter: Payer: Self-pay | Admitting: General Surgery

## 2021-08-19 ENCOUNTER — Other Ambulatory Visit: Payer: Self-pay | Admitting: General Surgery

## 2021-08-19 ENCOUNTER — Ambulatory Visit
Admission: RE | Admit: 2021-08-19 | Discharge: 2021-08-19 | Disposition: A | Discharge status: Home / Self Care | Payer: Medicare Other | Source: Ambulatory Visit | Attending: General Surgery | Admitting: General Surgery

## 2021-08-19 DIAGNOSIS — R1310 Dysphagia, unspecified: Secondary | ICD-10-CM | POA: Insufficient documentation

## 2022-07-13 ENCOUNTER — Ambulatory Visit: Payer: Medicare Other

## 2022-07-13 DIAGNOSIS — K640 First degree hemorrhoids: Secondary | ICD-10-CM | POA: Diagnosis not present

## 2022-07-13 DIAGNOSIS — K625 Hemorrhage of anus and rectum: Secondary | ICD-10-CM | POA: Diagnosis present

## 2022-07-13 DIAGNOSIS — R195 Other fecal abnormalities: Secondary | ICD-10-CM | POA: Diagnosis not present

## 2022-08-12 IMAGING — RF DG ESOPHAGUS
8 of 9 series · 14 of 24 positions shown · non-contrast
Comparison: NONE.

CLINICAL DATA: Patient with worsening dysphagia to breads/meats,
recent EGD unremarkable for stricture, ulceration or mass. Request
for esophagram to evaluate motility.

EXAM:
ESOPHAGUS/BARIUM SWALLOW/TABLET STUDY
TECHNIQUE: Combined double and single contrast examination was performed using
effervescent crystals, high-density barium, and thin liquid barium.
This exam was performed by Hassenteufel, Kanina, and was
supervised and interpreted by Geswint, Tashnie.
FLUOROSCOPY:
Radiation Exposure Index (as provided by the fluoroscopic device):
32.6 mGy Kerma

[Series 1: cp_standard · 0.17mm/px · 2 of 63 frames shown (1 of 7)]
[frame 10/63]
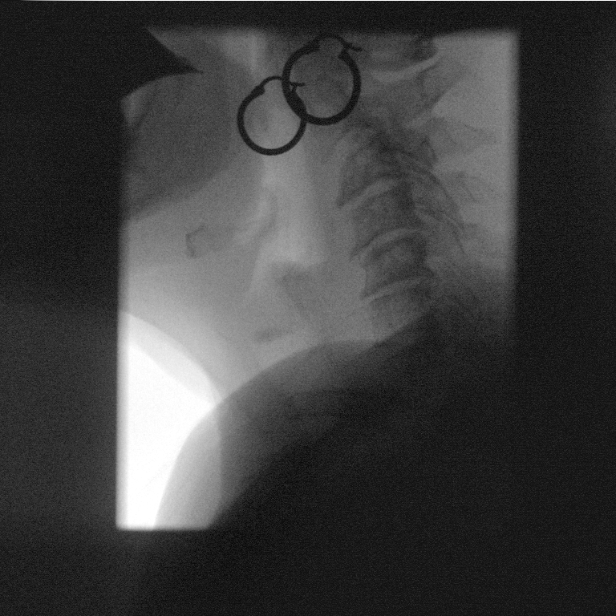
[frame 32/63]
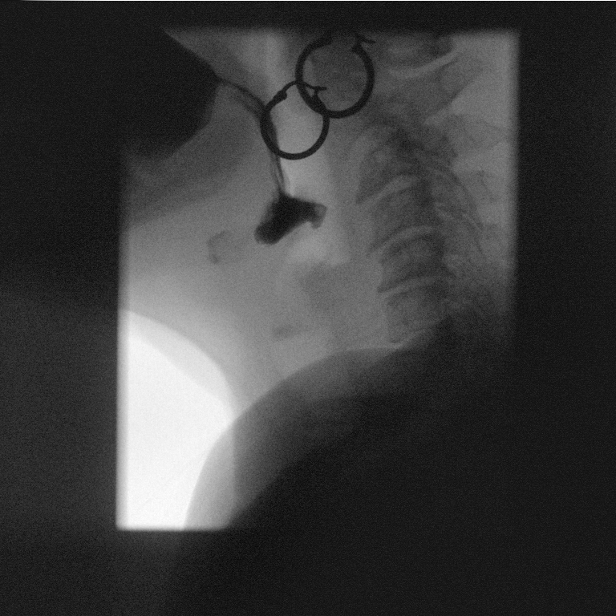

[Series 2: cp_standard · 0.17mm/px · 2 of 71 frames shown (2 of 7)]
[frame 36/71]
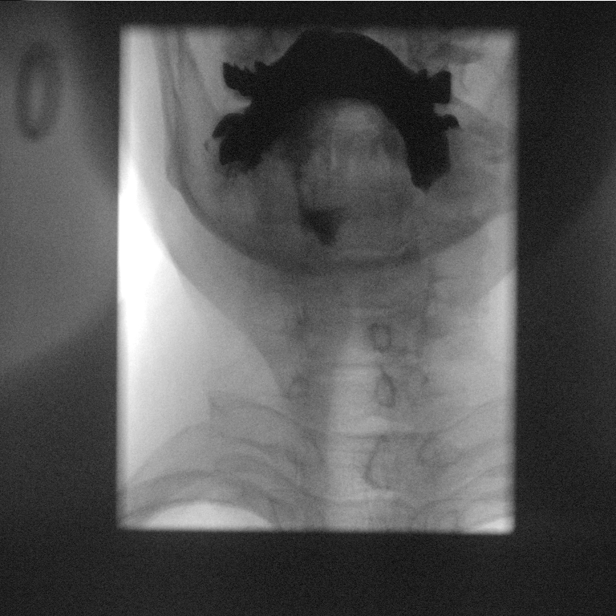
[frame 71/71]
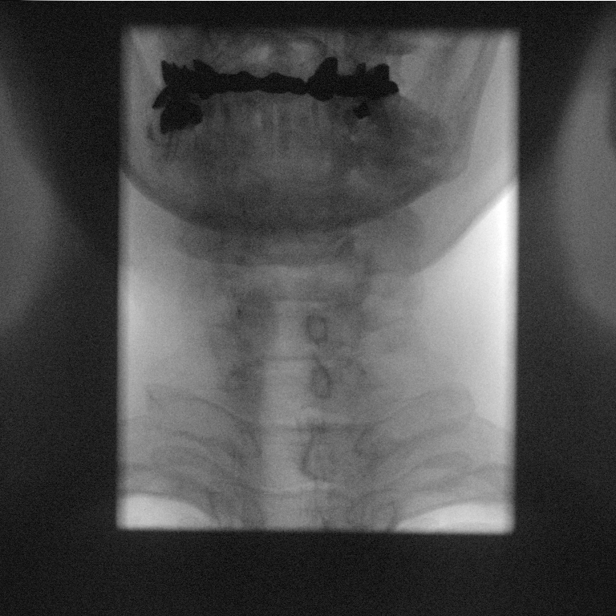

[Series 3: cp_standard · 0.17mm/px · 2 of 190 frames shown (3 of 7)]
[frame 96/190]
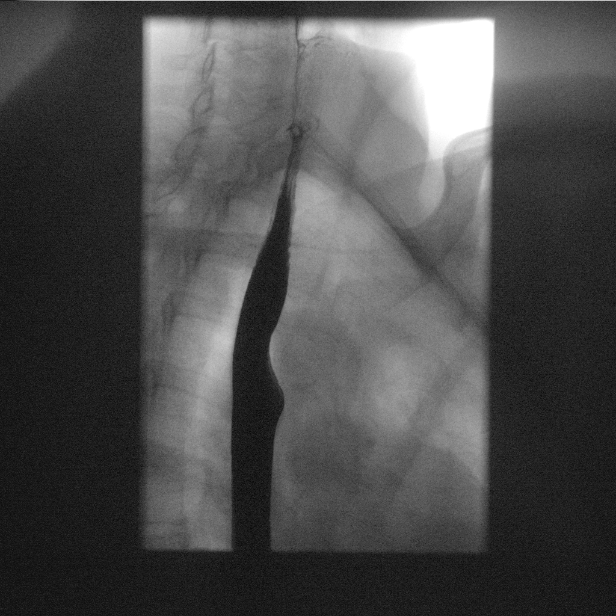
[frame 162/190]
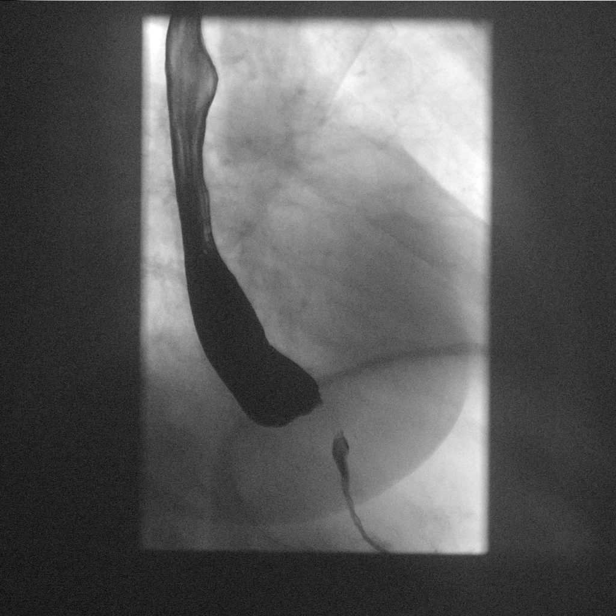

[Series 4: fluoro_barium 2fps_bw · 0.17mm/px · 1 of 3 frames shown]
[frame 2/3]
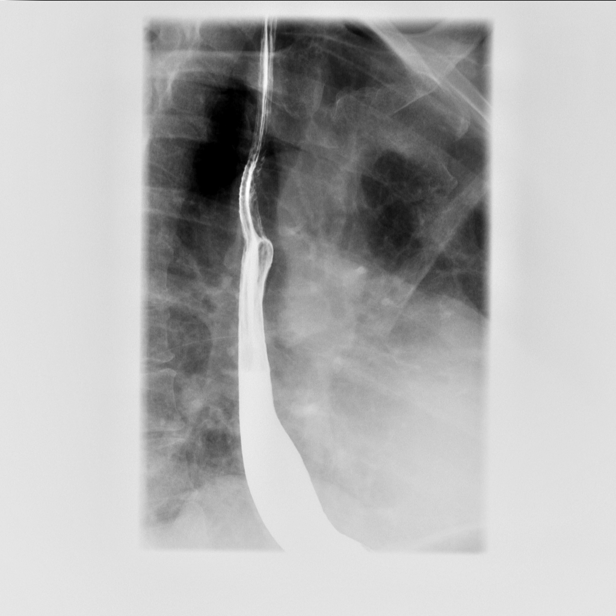

[Series 5: cp_standard · 0.17mm/px · 1 of 1 slices shown (4 of 7)]
[im 1/1]
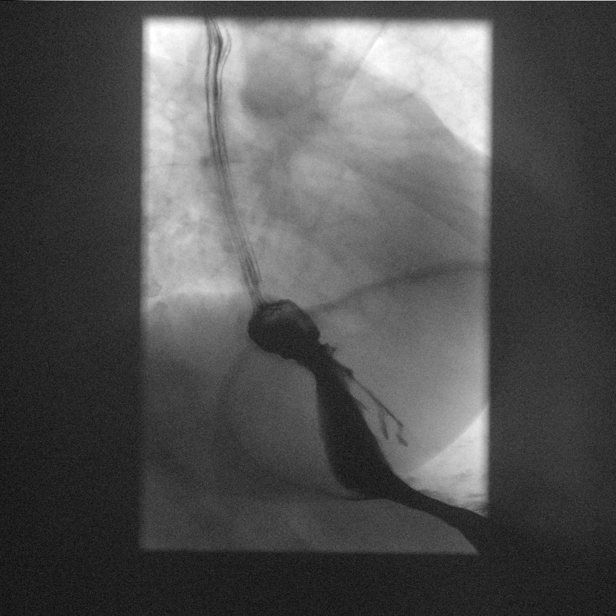

[Series 6: cp_standard · 0.26mm/px · 2 of 110 frames shown (5 of 7)]
[frame 56/110]
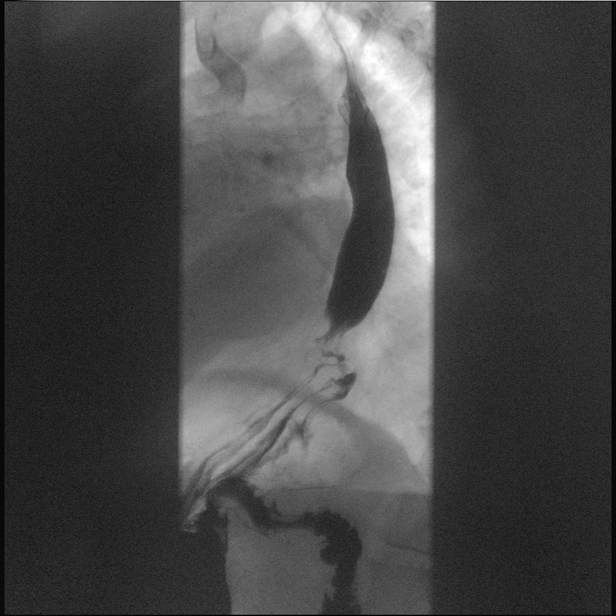
[frame 110/110]
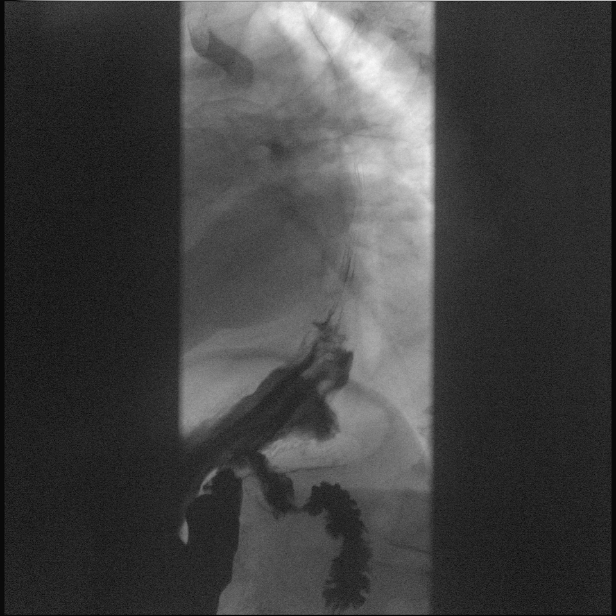

[Series 7: cp_standard · 0.26mm/px · 2 of 217 frames shown (6 of 7)]
[frame 109/217]
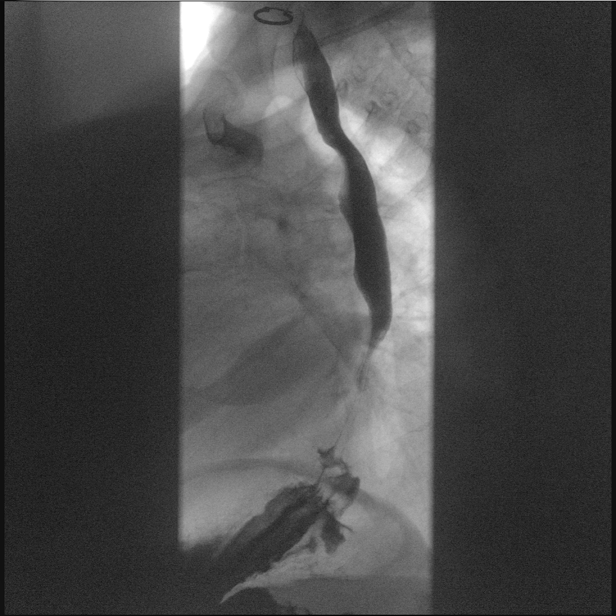
[frame 185/217]
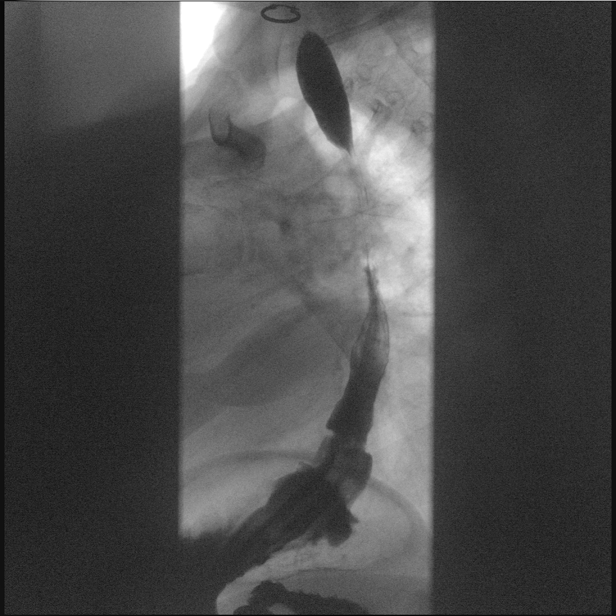

[Series 9: cp_standard · 0.25mm/px · 2 of 257 frames shown (7 of 7)]
[frame 104/257]
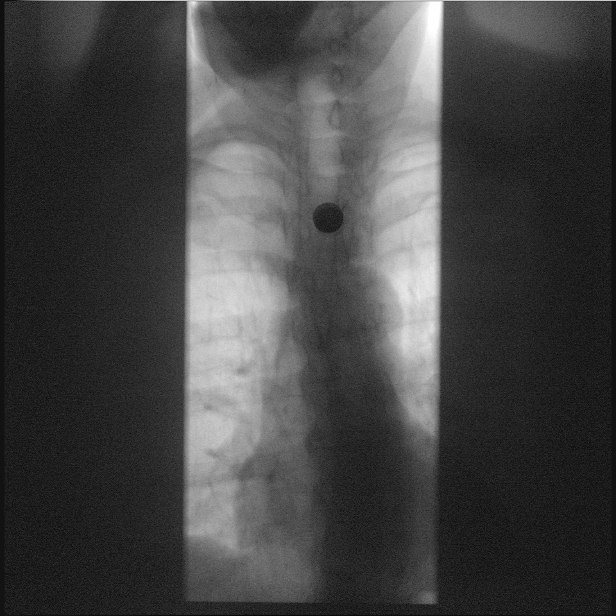
[frame 219/257]
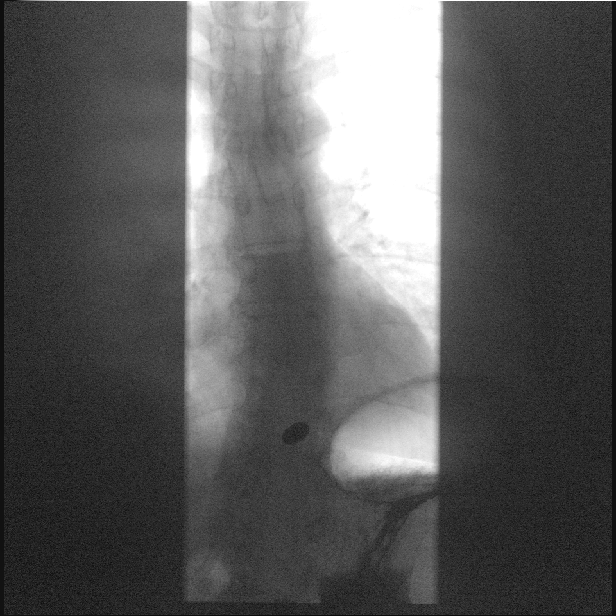

[14 of 24 positions shown; findings below may reference images not displayed]

FINDINGS: Swallowing: Appears normal. No vestibular penetration or aspiration
seen.

Pharynx: Unremarkable.

Esophagus: Normal appearance.

Esophageal motility: Moderate dysmotility, particularly in the lower
third of the esophagus

Hiatal Hernia: Small hiatal hernia seen in both supine and prone
positions

Gastroesophageal reflux: None visualized.

Ingested 13mm barium tablet: Passed normally

Other: None.
IMPRESSION: Tertiary contractions of the esophagus as can be seen with
esophageal spasm. Small hiatal hernia.

## 2023-10-17 ENCOUNTER — Ambulatory Visit: Payer: Self-pay | Admitting: Family Medicine
